# Patient Record
Sex: Male | Born: 1980
Health system: Southern US, Community
[De-identification: ages and names within clinical notes are randomized; demographics above are authoritative.]

## PROBLEM LIST (undated history)

## (undated) DIAGNOSIS — K279 Peptic ulcer, site unspecified, unspecified as acute or chronic, without hemorrhage or perforation: Secondary | ICD-10-CM

## (undated) DIAGNOSIS — K625 Hemorrhage of anus and rectum: Secondary | ICD-10-CM

## (undated) DIAGNOSIS — K219 Gastro-esophageal reflux disease without esophagitis: Secondary | ICD-10-CM

## (undated) DIAGNOSIS — E119 Type 2 diabetes mellitus without complications: Secondary | ICD-10-CM

## (undated) HISTORY — PX: OTHER SURGICAL HISTORY: SHX169

## (undated) HISTORY — PX: COLONOSCOPY: SHX174

## (undated) HISTORY — DX: Gastro-esophageal reflux disease without esophagitis: K21.9

---

## 2000-12-20 ENCOUNTER — Encounter: Payer: Self-pay | Admitting: *Deleted

## 2000-12-20 ENCOUNTER — Emergency Department (HOSPITAL_COMMUNITY): Admission: EM | Admit: 2000-12-20 | Discharge: 2000-12-20 | Payer: Self-pay | Admitting: *Deleted

## 2001-12-22 ENCOUNTER — Emergency Department (HOSPITAL_COMMUNITY): Admission: EM | Admit: 2001-12-22 | Discharge: 2001-12-22 | Payer: Self-pay | Admitting: Emergency Medicine

## 2001-12-22 ENCOUNTER — Emergency Department (HOSPITAL_COMMUNITY): Admission: EM | Admit: 2001-12-22 | Discharge: 2001-12-22 | Payer: Self-pay | Admitting: *Deleted

## 2001-12-29 ENCOUNTER — Emergency Department (HOSPITAL_COMMUNITY): Admission: EM | Admit: 2001-12-29 | Discharge: 2001-12-29 | Payer: Self-pay | Admitting: Emergency Medicine

## 2002-08-29 ENCOUNTER — Emergency Department (HOSPITAL_COMMUNITY): Admission: EM | Admit: 2002-08-29 | Discharge: 2002-08-29 | Payer: Self-pay | Admitting: *Deleted

## 2002-12-21 ENCOUNTER — Emergency Department (HOSPITAL_COMMUNITY): Admission: EM | Admit: 2002-12-21 | Discharge: 2002-12-21 | Payer: Self-pay | Admitting: *Deleted

## 2002-12-21 ENCOUNTER — Encounter: Payer: Self-pay | Admitting: *Deleted

## 2004-05-21 ENCOUNTER — Emergency Department (HOSPITAL_COMMUNITY): Admission: EM | Admit: 2004-05-21 | Discharge: 2004-05-22 | Payer: Self-pay | Admitting: Emergency Medicine

## 2004-06-08 ENCOUNTER — Emergency Department (HOSPITAL_COMMUNITY): Admission: EM | Admit: 2004-06-08 | Discharge: 2004-06-08 | Payer: Self-pay | Admitting: Emergency Medicine

## 2004-06-15 ENCOUNTER — Emergency Department (HOSPITAL_COMMUNITY): Admission: EM | Admit: 2004-06-15 | Discharge: 2004-06-16 | Payer: Self-pay | Admitting: Emergency Medicine

## 2004-09-18 ENCOUNTER — Emergency Department (HOSPITAL_COMMUNITY): Admission: EM | Admit: 2004-09-18 | Discharge: 2004-09-19 | Payer: Self-pay | Admitting: *Deleted

## 2004-10-12 ENCOUNTER — Emergency Department (HOSPITAL_COMMUNITY): Admission: EM | Admit: 2004-10-12 | Discharge: 2004-10-12 | Payer: Self-pay | Admitting: Emergency Medicine

## 2005-04-05 ENCOUNTER — Emergency Department (HOSPITAL_COMMUNITY): Admission: EM | Admit: 2005-04-05 | Discharge: 2005-04-05 | Payer: Self-pay | Admitting: Emergency Medicine

## 2006-06-04 ENCOUNTER — Emergency Department (HOSPITAL_COMMUNITY): Admission: EM | Admit: 2006-06-04 | Discharge: 2006-06-04 | Payer: Self-pay | Admitting: Emergency Medicine

## 2007-07-21 ENCOUNTER — Emergency Department (HOSPITAL_COMMUNITY): Admission: EM | Admit: 2007-07-21 | Discharge: 2007-07-21 | Payer: Self-pay | Admitting: Emergency Medicine

## 2007-07-23 ENCOUNTER — Emergency Department (HOSPITAL_COMMUNITY): Admission: EM | Admit: 2007-07-23 | Discharge: 2007-07-23 | Payer: Self-pay | Admitting: Emergency Medicine

## 2007-07-28 ENCOUNTER — Emergency Department (HOSPITAL_COMMUNITY): Admission: EM | Admit: 2007-07-28 | Discharge: 2007-07-29 | Payer: Self-pay | Admitting: Emergency Medicine

## 2007-10-28 ENCOUNTER — Emergency Department (HOSPITAL_COMMUNITY): Admission: EM | Admit: 2007-10-28 | Discharge: 2007-10-28 | Payer: Self-pay | Admitting: Emergency Medicine

## 2008-01-03 ENCOUNTER — Emergency Department (HOSPITAL_COMMUNITY): Admission: EM | Admit: 2008-01-03 | Discharge: 2008-01-03 | Payer: Self-pay | Admitting: Emergency Medicine

## 2008-10-15 ENCOUNTER — Emergency Department (HOSPITAL_COMMUNITY): Admission: EM | Admit: 2008-10-15 | Discharge: 2008-10-15 | Payer: Self-pay | Admitting: Emergency Medicine

## 2010-03-16 ENCOUNTER — Emergency Department (HOSPITAL_COMMUNITY): Admission: EM | Admit: 2010-03-16 | Discharge: 2010-03-16 | Payer: Self-pay | Admitting: Emergency Medicine

## 2010-05-25 ENCOUNTER — Emergency Department (HOSPITAL_COMMUNITY): Admission: EM | Admit: 2010-05-25 | Discharge: 2010-05-25 | Payer: Self-pay | Admitting: Emergency Medicine

## 2010-10-29 ENCOUNTER — Emergency Department (HOSPITAL_COMMUNITY)
Admission: EM | Admit: 2010-10-29 | Discharge: 2010-10-29 | Disposition: A | Discharge status: Home / Self Care | Payer: Medicaid Other | Attending: Emergency Medicine | Admitting: Emergency Medicine

## 2010-10-29 DIAGNOSIS — W57XXXA Bitten or stung by nonvenomous insect and other nonvenomous arthropods, initial encounter: Secondary | ICD-10-CM | POA: Insufficient documentation

## 2010-10-29 DIAGNOSIS — S30860A Insect bite (nonvenomous) of lower back and pelvis, initial encounter: Secondary | ICD-10-CM | POA: Insufficient documentation

## 2011-02-22 ENCOUNTER — Emergency Department (HOSPITAL_COMMUNITY)
Admission: EM | Admit: 2011-02-22 | Discharge: 2011-02-22 | Disposition: A | Discharge status: Home / Self Care | Payer: Medicaid Other | Attending: Emergency Medicine | Admitting: Emergency Medicine

## 2011-02-22 DIAGNOSIS — Z87891 Personal history of nicotine dependence: Secondary | ICD-10-CM | POA: Insufficient documentation

## 2011-02-22 DIAGNOSIS — R202 Paresthesia of skin: Secondary | ICD-10-CM

## 2011-02-22 DIAGNOSIS — R209 Unspecified disturbances of skin sensation: Secondary | ICD-10-CM | POA: Insufficient documentation

## 2011-02-22 NOTE — ED Provider Notes (Signed)
Scribed for Glynn Octave, MD, the patient was seen in room 12. This chart was scribed by Jannette Fogo. This patient's care was started at 10:07.    CSN: [id number] Arrival date & time: 02/22/2011  9:57 AM  Chief Complaint  Patient presents with  . Numbness   HPI Casey Williams is a 30 y.o. male who presents to the Emergency Department complaining of right calf "tingling" since 07:60M. Patient woke up this AM to urinate and noticed right lower extremity "tingling". He suspected that he slept wrong on the leg last night but the "tingling" sensation has persisted for ~3 hours. Reports a "tingling/asleep" sensation from right ankle to anterior shin but he denies any pain or sensory problem. Patient denies a history of similar symptoms in the past. He denies any associated back pain but reports an episode of left back pain yesterday which resolved. He denies any recent injury or falls. Denies a history of diabetes mellitus. There are no other associated symptoms and no other alleviating or aggravating factors.    HPI ELEMENTS:  Location: Right calf  Onset: at 07:00AM Duration: ~3 hours  Timing: constant   Quality: "tingling'  Context: as above     PAST MEDICAL HISTORY:  1. Acid reflux, occasionally takes Tums 2. Possible hemorrhoids    MEDICATIONS:  Previous Medications   CALCIUM CARBONATE (TUMS EX) 750 MG CHEWABLE TABLET    Chew 1 tablet by mouth daily. For heartburn      ALLERGIES:  Allergies as of 02/22/2011  . (No Known Allergies)     FAMILY HISTORY:  No Pertinent Family History   SOCIAL HISTORY: Accompanied to the ED by aunt who is an Charity fundraiser History  Substance Use Topics  . Smoking status: Former Games developer  . Smokeless tobacco: Not on file  . Alcohol Use: No     Review of Systems  Musculoskeletal: Negative for back pain.  Neurological: Positive for numbness ("tingling" right calf ). Negative for weakness.  All other systems reviewed and are negative.    Physical  Exam  BP 141/70  Pulse 78  Temp(Src) 98.2 F (36.8 C) (Oral)  Resp 20  Ht 5\' 10"  (1.778 m)  Wt 289 lb (131.09 kg)  BMI 41.47 kg/m2  SpO2 99%  Physical Exam  Constitutional: He is oriented to person, place, and time. He appears well-developed and well-nourished. No distress.  HENT:  Head: Normocephalic and atraumatic.  Mouth/Throat: Oropharynx is clear and moist.  Eyes: Conjunctivae are normal. Pupils are equal, round, and reactive to light.  Neck: Normal range of motion. Neck supple.  Cardiovascular: Intact distal pulses.   Pulses:      Dorsalis pedis pulses are 2+ on the right side, and 2+ on the left side.       Posterior tibial pulses are 2+ on the right side, and 2+ on the left side.  Pulmonary/Chest: Effort normal.  Abdominal: Soft. He exhibits no distension.  Musculoskeletal: Normal range of motion. He exhibits no edema and no tenderness.       Cervical back: Normal. He exhibits no tenderness.       Thoracic back: Normal. He exhibits no tenderness.       Lumbar back: Normal. He exhibits no tenderness.       Normal bilateral straight leg raise test.  Right calf is soft and non tender. No asymmetrical  swelling.   Neurological: He is alert and oriented to person, place, and time. He has normal strength and normal reflexes. No cranial  nerve deficit or sensory deficit.       Pinprick sensation intact in L4-L4-S1. Great toe extension intact.      Procedures  OTHER DATA REVIEWED: Nursing notes and vital signs reviewed.  DIAGNOSTIC STUDIES: Oxygen Saturation is 99% on room air, normal by my interpretation.    LABS: Results for orders placed during the hospital encounter of 02/22/11  GLUCOSE, CAPILLARY      Component Value Range   Glucose-Capillary 150 (*) 70 - 99 (mg/dL)    ED COURSE / COORDINATION OF CARE: 10:49 - Re-examine, ED physician discussed Glucose level with the patient and family. Advised patient to ger reevaluated if symptoms persist or radiate to the  back or up the leg. Patient stable for discharge.    MDM: paresthesias, appear peripheral.  Involves L4 and L5 dermatomes but no motor weakness, pinprick sensation intact.   IMPRESSION: Diagnoses that have been ruled out:  Diagnoses that are still under consideration:  Final diagnoses:  Paresthesia    PLAN:  Home  The patient is to return the emergency department if there is any worsening of symptoms. I have reviewed the discharge instructions with the patient and family,    CONDITION ON DISCHARGE: Stable   MEDICATIONS GIVEN IN THE E.D.  Medications  calcium carbonate (TUMS EX) 750 MG chewable tablet (not administered)     DISCHARGE MEDICATIONS: New Prescriptions   No medications on file   I personally performed the services described in this documentation, which was scribed in my presence.  The recorded information has been reviewed and considered.        Glynn Octave, MD 02/22/11 1426

## 2011-02-22 NOTE — ED Notes (Signed)
Pt reports waking this a.m with numbness and tingling in rt leg.  Pt denies any injury.  Pt states that he has feeling in it but can't make the tingling sensation go away.  nad noted

## 2011-04-01 LAB — WOUND CULTURE

## 2011-04-02 LAB — I-STAT 8, (EC8 V) (CONVERTED LAB)
Acid-Base Excess: 4 — ABNORMAL HIGH
Chloride: 102
TCO2: 32
pCO2, Ven: 50.5 — ABNORMAL HIGH
pH, Ven: 7.383 — ABNORMAL HIGH

## 2011-04-07 LAB — URINE CULTURE
Colony Count: NO GROWTH
Culture: NO GROWTH

## 2011-04-07 LAB — URINALYSIS, ROUTINE W REFLEX MICROSCOPIC
Glucose, UA: NEGATIVE
pH: 6

## 2011-05-27 ENCOUNTER — Emergency Department (HOSPITAL_COMMUNITY)
Admission: EM | Admit: 2011-05-27 | Discharge: 2011-05-27 | Disposition: A | Discharge status: Home / Self Care | Payer: Medicaid Other | Attending: Emergency Medicine | Admitting: Emergency Medicine

## 2011-05-27 ENCOUNTER — Encounter (HOSPITAL_COMMUNITY): Payer: Self-pay | Admitting: *Deleted

## 2011-05-27 DIAGNOSIS — K529 Noninfective gastroenteritis and colitis, unspecified: Secondary | ICD-10-CM

## 2011-05-27 DIAGNOSIS — H539 Unspecified visual disturbance: Secondary | ICD-10-CM | POA: Insufficient documentation

## 2011-05-27 DIAGNOSIS — Z87891 Personal history of nicotine dependence: Secondary | ICD-10-CM | POA: Insufficient documentation

## 2011-05-27 DIAGNOSIS — R42 Dizziness and giddiness: Secondary | ICD-10-CM | POA: Insufficient documentation

## 2011-05-27 DIAGNOSIS — K5289 Other specified noninfective gastroenteritis and colitis: Secondary | ICD-10-CM | POA: Insufficient documentation

## 2011-05-27 LAB — DIFFERENTIAL
Basophils Absolute: 0 10*3/uL (ref 0.0–0.1)
Basophils Relative: 0 % (ref 0–1)
Eosinophils Absolute: 0 10*3/uL (ref 0.0–0.7)
Lymphs Abs: 1 10*3/uL (ref 0.7–4.0)
Neutrophils Relative %: 82 % — ABNORMAL HIGH (ref 43–77)

## 2011-05-27 LAB — CBC
MCH: 26.4 pg (ref 26.0–34.0)
MCHC: 32.3 g/dL (ref 30.0–36.0)
Platelets: 195 10*3/uL (ref 150–400)
RBC: 4.36 MIL/uL (ref 4.22–5.81)
RDW: 13.8 % (ref 11.5–15.5)

## 2011-05-27 LAB — COMPREHENSIVE METABOLIC PANEL
ALT: 89 U/L — ABNORMAL HIGH (ref 0–53)
AST: 61 U/L — ABNORMAL HIGH (ref 0–37)
Albumin: 4 g/dL (ref 3.5–5.2)
Alkaline Phosphatase: 78 U/L (ref 39–117)
Potassium: 4 mEq/L (ref 3.5–5.1)
Sodium: 133 mEq/L — ABNORMAL LOW (ref 135–145)
Total Protein: 8.5 g/dL — ABNORMAL HIGH (ref 6.0–8.3)

## 2011-05-27 MED ORDER — SODIUM CHLORIDE 0.9 % IV BOLUS (SEPSIS)
1000.0000 mL | Freq: Once | INTRAVENOUS | Status: AC
  Administered 2011-05-27: 1000 mL via INTRAVENOUS

## 2011-05-27 MED ORDER — SODIUM CHLORIDE 0.9 % IV BOLUS (SEPSIS): 1000.0000 mL | Freq: Once | INTRAVENOUS | Status: DC

## 2011-05-27 MED ORDER — ONDANSETRON HCL 4 MG/2ML IJ SOLN
4.0000 mg | Freq: Once | INTRAMUSCULAR | Status: AC
  Administered 2011-05-27: 4 mg via INTRAVENOUS
  Filled 2011-05-27: qty 2

## 2011-05-27 MED ORDER — PROMETHAZINE HCL 25 MG PO TABS: 25.0000 mg | ORAL_TABLET | Freq: Four times a day (QID) | ORAL | Status: DC | PRN

## 2011-05-27 MED ORDER — CIPROFLOXACIN HCL 500 MG PO TABS: 500.0000 mg | ORAL_TABLET | Freq: Two times a day (BID) | ORAL | Status: AC

## 2011-05-27 NOTE — ED Provider Notes (Signed)
Scribed for Benny Lennert, MD, the patient was seen in room APA12/APA12. This chart was scribed by [employer]. The patient's care started at 19:39  CSN: [id number] Arrival date & time: 05/27/2011  7:10 PM   First MD Initiated Contact with Patient 05/27/11 1939      Chief Complaint  Patient presents with  . Excessive Sweating  . Emesis  . Diarrhea  . Dizziness  . Abdominal Pain   HPI Casey Williams is a 30 y.o. male who presents to the Emergency Department complaining of Excessive Sweating, Emesis, Diarrhea, Dizziness and Abdominal pain onset yesterday. Reports blood in diarrhea. He also complains of mild abdominal pain starting since yesterday. He describes his sweating as "cold sweats". Denies any hematemesis. He also reports blurred vision and dizziness. Patient denies a history of similar symptoms and is currently in no apparent distress.  History reviewed. No pertinent past medical history.  History reviewed. No pertinent past surgical history.  History reviewed. No pertinent family history.  History  Substance Use Topics  . Smoking status: Former Games developer  . Smokeless tobacco: Not on file  . Alcohol Use: No      Review of Systems  Constitutional: Negative for fatigue.  HENT: Negative for congestion, sinus pressure and ear discharge.   Eyes: Positive for visual disturbance. Negative for discharge.  Respiratory: Negative for cough.   Cardiovascular: Negative for chest pain.  Gastrointestinal: Positive for nausea, vomiting, diarrhea and blood in stool. Negative for abdominal pain.  Genitourinary: Negative for dysuria, frequency and hematuria.  Musculoskeletal: Negative for back pain.  Skin: Negative for rash.  Neurological: Positive for dizziness. Negative for seizures and headaches.  Hematological: Negative.   Psychiatric/Behavioral: Negative for hallucinations.  All other systems reviewed and are negative.    Allergies  Review of patient's allergies  indicates no known allergies.  Home Medications   Current Outpatient Rx  Name Route Sig Dispense Refill  . CALCIUM CARBONATE ANTACID 750 MG PO CHEW Oral Chew 1 tablet by mouth daily. For heartburn    . CIPROFLOXACIN HCL 500 MG PO TABS Oral Take 1 tablet (500 mg total) by mouth every 12 (twelve) hours. 14 tablet 0  . PROMETHAZINE HCL 25 MG PO TABS Oral Take 1 tablet (25 mg total) by mouth every 6 (six) hours as needed for nausea. 15 tablet 0    BP 116/71  Pulse 121  Temp(Src) 99.2 F (37.3 C) (Oral)  Resp 18  Ht 5\' 10"  (1.778 m)  Wt 285 lb (129.275 kg)  BMI 40.89 kg/m2  SpO2 96%  Physical Exam  Vitals reviewed. Constitutional: He is oriented to person, place, and time. He appears well-developed.  HENT:  Head: Normocephalic and atraumatic.  Mouth/Throat: Mucous membranes are dry.  Eyes: Conjunctivae and EOM are normal. No scleral icterus.  Neck: Neck supple. No thyromegaly present.  Cardiovascular: Normal rate and regular rhythm.  Exam reveals no gallop and no friction rub.   No murmur heard. Pulmonary/Chest: No stridor. He has no wheezes. He has no rales. He exhibits no tenderness.  Abdominal: He exhibits no distension. There is no tenderness. There is no rebound.  Musculoskeletal: Normal range of motion. He exhibits no edema.  Lymphadenopathy:    He has no cervical adenopathy.  Neurological: He is oriented to person, place, and time. Coordination normal.  Skin: No rash noted. No erythema.  Psychiatric: He has a normal mood and affect. His behavior is normal.    ED Course  Procedures DIAGNOSTIC STUDIES: Oxygen Saturation  is 96% on room air, normal by my interpretation.    COORDINATION OF CARE: 20:30 - EDP examined patient at bedside. IV Fluids ordered. Orders Placed This Encounter  Procedures  . CBC  . Differential  . Comprehensive metabolic panel    Results for orders placed during the hospital encounter of 05/27/11  CBC      Component Value Range   WBC 9.8   4.0 - 10.5 (K/uL)   RBC 4.36  4.22 - 5.81 (MIL/uL)   Hemoglobin 11.5 (*) 13.0 - 17.0 (g/dL)   HCT 21.3 (*) 08.6 - 52.0 (%)   MCV 81.7  78.0 - 100.0 (fL)   MCH 26.4  26.0 - 34.0 (pg)   MCHC 32.3  30.0 - 36.0 (g/dL)   RDW 57.8  46.9 - 62.9 (%)   Platelets 195  150 - 400 (K/uL)  DIFFERENTIAL      Component Value Range   Neutrophils Relative 82 (*) 43 - 77 (%)   Neutro Abs 8.1 (*) 1.7 - 7.7 (K/uL)   Lymphocytes Relative 10 (*) 12 - 46 (%)   Lymphs Abs 1.0  0.7 - 4.0 (K/uL)   Monocytes Relative 7  3 - 12 (%)   Monocytes Absolute 0.7  0.1 - 1.0 (K/uL)   Eosinophils Relative 0  0 - 5 (%)   Eosinophils Absolute 0.0  0.0 - 0.7 (K/uL)   Basophils Relative 0  0 - 1 (%)   Basophils Absolute 0.0  0.0 - 0.1 (K/uL)  COMPREHENSIVE METABOLIC PANEL      Component Value Range   Sodium 133 (*) 135 - 145 (mEq/L)   Potassium 4.0  3.5 - 5.1 (mEq/L)   Chloride 97  96 - 112 (mEq/L)   CO2 26  19 - 32 (mEq/L)   Glucose, Bld 177 (*) 70 - 99 (mg/dL)   BUN 9  6 - 23 (mg/dL)   Creatinine, Ser 5.28  0.50 - 1.35 (mg/dL)   Calcium 9.6  8.4 - 41.3 (mg/dL)   Total Protein 8.5 (*) 6.0 - 8.3 (g/dL)   Albumin 4.0  3.5 - 5.2 (g/dL)   AST 61 (*) 0 - 37 (U/L)   ALT 89 (*) 0 - 53 (U/L)   Alkaline Phosphatase 78  39 - 117 (U/L)   Total Bilirubin 0.6  0.3 - 1.2 (mg/dL)   GFR calc non Af Amer >90  >90 (mL/min)   GFR calc Af Amer >90  >90 (mL/min)     colitis  The chart was scribed for me under my direct supervision.  I personally performed the history, physical, and medical decision making and all procedures in the evaluation of this patient.Benny Lennert, MD 05/27/11 [phone]

## 2011-05-27 NOTE — ED Notes (Signed)
Pt states nausea is better; pt sitting in chair at bedside sipping on water with no complaints and is comfortable

## 2011-05-27 NOTE — ED Notes (Signed)
Pt c/o n/v/d and dizziness, pt states he feels weak and is having blurred vision

## 2011-09-28 ENCOUNTER — Encounter (HOSPITAL_COMMUNITY): Payer: Self-pay | Admitting: Oncology

## 2011-09-28 ENCOUNTER — Emergency Department (HOSPITAL_COMMUNITY)
Admission: EM | Admit: 2011-09-28 | Discharge: 2011-09-28 | Disposition: A | Discharge status: Home / Self Care | Payer: Medicaid Other | Attending: Emergency Medicine | Admitting: Emergency Medicine

## 2011-09-28 DIAGNOSIS — K648 Other hemorrhoids: Secondary | ICD-10-CM | POA: Insufficient documentation

## 2011-09-28 DIAGNOSIS — K625 Hemorrhage of anus and rectum: Secondary | ICD-10-CM | POA: Insufficient documentation

## 2011-09-28 HISTORY — DX: Hemorrhage of anus and rectum: K62.5

## 2011-09-28 LAB — OCCULT BLOOD, POC DEVICE: Fecal Occult Bld: POSITIVE

## 2011-09-28 LAB — DIFFERENTIAL
Basophils Absolute: 0 10*3/uL (ref 0.0–0.1)
Basophils Relative: 0 % (ref 0–1)
Eosinophils Relative: 3 % (ref 0–5)
Lymphocytes Relative: 33 % (ref 12–46)
Monocytes Absolute: 0.5 10*3/uL (ref 0.1–1.0)
Monocytes Relative: 5 % (ref 3–12)

## 2011-09-28 LAB — CBC
HCT: 36.1 % — ABNORMAL LOW (ref 39.0–52.0)
Hemoglobin: 11.9 g/dL — ABNORMAL LOW (ref 13.0–17.0)
MCHC: 33 g/dL (ref 30.0–36.0)
MCV: 82.4 fL (ref 78.0–100.0)
RDW: 13.9 % (ref 11.5–15.5)

## 2011-09-28 LAB — BASIC METABOLIC PANEL
BUN: 9 mg/dL (ref 6–23)
CO2: 26 mEq/L (ref 19–32)
Calcium: 9.7 mg/dL (ref 8.4–10.5)
Creatinine, Ser: 0.87 mg/dL (ref 0.50–1.35)

## 2011-09-28 MED ORDER — SODIUM CHLORIDE 0.9 % IV BOLUS (SEPSIS): 1000.0000 mL | Freq: Once | INTRAVENOUS | Status: DC

## 2011-09-28 NOTE — Discharge Instructions (Signed)
Make an appointment with a gastroenterologist for further evaluation. Return to the emergency department if your bleeding is getting worse, or if he started feeling dizzy or lightheaded.  Rectal Bleeding Rectal bleeding is when blood passes out of the anus. It is usually a sign that something is wrong. It may not be serious, but it should always be evaluated. Rectal bleeding may present as bright red blood or extremely dark stools. The color may range from dark red or maroon to black (like tar). It is important that the cause of rectal bleeding be identified so treatment can be started and the problem corrected. CAUSES   Hemorrhoids. These are enlarged (dilated) blood vessels or veins in the anal or rectal area.   Fistulas. Theseare abnormal, burrowing channels that usually run from inside the rectum to the skin around the anus. They can bleed.   Anal fissures. This is a tear in the tissue of the anus. Bleeding occurs with bowel movements.   Diverticulosis. This is a condition in which pockets or sacs project from the bowel wall. Occasionally, the sacs can bleed.   Diverticulitis. Thisis an infection involving diverticulosis of the colon.   Proctitis and colitis. These are conditions in which the rectum, colon, or both, can become inflamed and pitted (ulcerated).   Polyps and cancer. Polyps are non-cancerous (benign) growths in the colon that may bleed. Certain types of polyps turn into cancer.   Protrusion of the rectum. Part of the rectum can project from the anus and bleed.   Certain medicines.   Intestinal infections.   Blood vessel abnormalities.  HOME CARE INSTRUCTIONS  Eat a high-fiber diet to keep your stool soft.   Limit activity.   Drink enough fluids to keep your urine clear or pale yellow.   Warm baths may be useful to soothe rectal pain.   Follow up with your caregiver as directed.  SEEK IMMEDIATE MEDICAL CARE IF:  You develop increased bleeding.   You have  black or dark red stools.   You vomit blood or material that looks like coffee grounds.   You have abdominal pain or tenderness.   You have a fever.   You feel weak, nauseous, or you faint.   You have severe rectal pain or you are unable to have a bowel movement.  MAKE SURE YOU:  Understand these instructions.   Will watch your condition.   Will get help right away if you are not doing well or get worse.  Document Released: 12/19/2001 Document Revised: 06/18/2011 Document Reviewed: 12/14/2010 [facility] Patient Information 2012 [place], [place].

## 2011-09-28 NOTE — ED Provider Notes (Signed)
History   This chart was scribed for Dione Booze, MD by Clarita Crane. The patient was seen in room APA12/APA12. Patient's care was started at 0734.    CSN: [id number]  Arrival date & time 09/28/11  4098   First MD Initiated Contact with Patient 09/28/11 [phone]      Chief Complaint  Patient presents with  . Rectal Bleeding    (Consider location/radiation/quality/duration/timing/severity/associated sxs/prior treatment) HPI Casey Williams is a 31 y.o. male who presents to the Emergency Department complaining of constant moderate hematochezia onset 2 weeks ago and persistent since with mild intermittent abdominal pain. Patient states blood is mixed with stool but reports he will visualize an increased volume of blood with wiping. Reports symptoms are aggravated by nothing and were temporarily relieved while using antibiotics. Denies nausea, vomiting, dizziness, lightheadedness. Reports experiencing similar hematochezia 6 months ago. Patient is a non drinker and non-smoker.  PCP- [facility]  Past Medical History  Diagnosis Date  . Bright red rectal bleeding     History reviewed. No pertinent past surgical history.  No family history on file.  History  Substance Use Topics  . Smoking status: Former Games developer  . Smokeless tobacco: Not on file  . Alcohol Use: Yes     social      Review of Systems  Allergies  Review of patient's allergies indicates no known allergies.  Home Medications   Current Outpatient Rx  Name Route Sig Dispense Refill  . CALCIUM CARBONATE ANTACID 750 MG PO CHEW Oral Chew 1 tablet by mouth daily. For heartburn      BP 132/81  Pulse 86  Temp(Src) 98.9 F (37.2 C) (Oral)  Resp 16  SpO2 97%  Physical Exam  Nursing note and vitals reviewed. Constitutional: He is oriented to person, place, and time. He appears well-developed and well-nourished. No distress.  HENT:  Head: Normocephalic and atraumatic.  Eyes: EOM are normal. Pupils are equal, round, and  reactive to light.  Neck: Neck supple. No tracheal deviation present.  Cardiovascular: Normal rate and regular rhythm.   No murmur heard. Pulmonary/Chest: Effort normal. No respiratory distress. He has no wheezes. He has no rales.  Abdominal: Soft. He exhibits no distension.  Genitourinary:       Chaperone present for rectal exam. Small internal hemorrhoid present. Stool is [place] and hemeoccult positive.   Musculoskeletal: Normal range of motion. He exhibits no edema.  Neurological: He is alert and oriented to person, place, and time. No sensory deficit.  Skin: Skin is warm and dry.  Psychiatric: He has a normal mood and affect. His behavior is normal.    ED Course  Procedures (including critical care time)  DIAGNOSTIC STUDIES: Oxygen Saturation is 97% on room air, normal by my interpretation.    COORDINATION OF CARE: 7:54AM- Dione Booze, MD to bedside to perform Rectal exam.  10:50AM- Patient informed of lab results and intent to arrange follow up for possible colonoscopy. Advised of reasons to return to ED. Patient agrees with plan set forth.  Results for orders placed during the hospital encounter of 09/28/11  CBC      Component Value Range   WBC 10.0  4.0 - 10.5 (K/uL)   RBC 4.38  4.22 - 5.81 (MIL/uL)   Hemoglobin 11.9 (*) 13.0 - 17.0 (g/dL)   HCT 47.8 (*) 29.5 - 52.0 (%)   MCV 82.4  78.0 - 100.0 (fL)   MCH 27.2  26.0 - 34.0 (pg)   MCHC 33.0  30.0 - 36.0 (  g/dL)   RDW 40.9  81.1 - 91.4 (%)   Platelets 213  150 - 400 (K/uL)  DIFFERENTIAL      Component Value Range   Neutrophils Relative 58  43 - 77 (%)   Neutro Abs 5.8  1.7 - 7.7 (K/uL)   Lymphocytes Relative 33  12 - 46 (%)   Lymphs Abs 3.3  0.7 - 4.0 (K/uL)   Monocytes Relative 5  3 - 12 (%)   Monocytes Absolute 0.5  0.1 - 1.0 (K/uL)   Eosinophils Relative 3  0 - 5 (%)   Eosinophils Absolute 0.3  0.0 - 0.7 (K/uL)   Basophils Relative 0  0 - 1 (%)   Basophils Absolute 0.0  0.0 - 0.1 (K/uL)  BASIC METABOLIC PANEL       Component Value Range   Sodium 138  135 - 145 (mEq/L)   Potassium 4.1  3.5 - 5.1 (mEq/L)   Chloride 102  96 - 112 (mEq/L)   CO2 26  19 - 32 (mEq/L)   Glucose, Bld 134 (*) 70 - 99 (mg/dL)   BUN 9  6 - 23 (mg/dL)   Creatinine, Ser 7.82  0.50 - 1.35 (mg/dL)   Calcium 9.7  8.4 - 95.6 (mg/dL)   GFR calc non Af Amer >90  >90 (mL/min)   GFR calc Af Amer >90  >90 (mL/min)  OCCULT BLOOD, POC DEVICE      Component Value Range   Fecal Occult Bld POSITIVE     Hemoglobin is not significantly different from last recorded hemoglobin. He has had no active bleeding in the emergency department. Orthostatic vital signs are significant for a rise in heart rate, but no change in pulse. He should have a GI evaluation for his bleeding. The only definite finding I found was a small hemorrhoid which may indeed be the source of some of this bleeding appeared is referred to Dr. Darrick Penna who is on call for GI for followup.  1. Rectal bleeding       MDM  Rectal bleeding, cause uncertain.      I personally performed the services described in this documentation, which was scribed in my presence. The recorded information has been reviewed and considered.      Dione Booze, MD 09/28/11 1054

## 2011-09-28 NOTE — ED Notes (Signed)
Casey Williams reports history of rectal bleeding, states he was previously told he had a "lower intestine" infection and was "treated with antibiotics."  Today patient is very vague on the onset of symptoms for today's visit - just states that he has noticed worsening bright red blood in his stool x "a while."  Pt denies vomiting, diarrhea, or fevers.

## 2011-09-29 ENCOUNTER — Encounter: Payer: Self-pay | Admitting: Gastroenterology

## 2011-09-29 ENCOUNTER — Ambulatory Visit (INDEPENDENT_AMBULATORY_CARE_PROVIDER_SITE_OTHER): Payer: Medicaid Other | Admitting: Gastroenterology

## 2011-09-29 VITALS — BP 130/78 | HR 66 | Temp 98.4°F | Ht 70.0 in | Wt 295.2 lb

## 2011-09-29 DIAGNOSIS — D649 Anemia, unspecified: Secondary | ICD-10-CM | POA: Insufficient documentation

## 2011-09-29 DIAGNOSIS — K6289 Other specified diseases of anus and rectum: Secondary | ICD-10-CM | POA: Insufficient documentation

## 2011-09-29 DIAGNOSIS — K625 Hemorrhage of anus and rectum: Secondary | ICD-10-CM

## 2011-09-29 DIAGNOSIS — K219 Gastro-esophageal reflux disease without esophagitis: Secondary | ICD-10-CM | POA: Insufficient documentation

## 2011-09-29 DIAGNOSIS — K5900 Constipation, unspecified: Secondary | ICD-10-CM | POA: Insufficient documentation

## 2011-09-29 MED ORDER — POLYETHYLENE GLYCOL 3350 17 GM/SCOOP PO POWD: 17.0000 g | Freq: Every day | ORAL | Status: DC

## 2011-09-29 MED ORDER — OMEPRAZOLE 20 MG PO CPDR: 20.0000 mg | DELAYED_RELEASE_CAPSULE | Freq: Every day | ORAL | Status: DC

## 2011-09-29 NOTE — Assessment & Plan Note (Signed)
4 month history of intermittent rectal bleeding generally associated with passing a hard stool and having rectal pain. Likely anorectal fissure or hemorrhoid. Doubt IBD. Recommend colonoscopy for further evaluation. Patient also with mild normocytic anemia which needs to be further evaluated.  I have discussed the risks, alternatives, benefits with regards to but not limited to the risk of reaction to medication, bleeding, infection, perforation and the patient is agreeable to proceed. Written consent to be obtained.   He will add MiraLax 17 g daily. Handout on constipation provided.

## 2011-09-29 NOTE — Progress Notes (Signed)
Primary Care Physician:  HOWARD,KEVIN P, MD, MD  Primary Gastroenterologist:  Sandi Fields, MD   Chief Complaint  Patient presents with  . Rectal Bleeding  . Heartburn    HPI:  Casey Williams is a 31 y.o. male here for further evaluation of chronic heartburn and rectal bleeding. He was seen in the emergency department yesterday with complaints of increasing amounts of rectal bleeding. Four months ago started with tearing pain in rectum with hard stools, also with n/v/d. Seen in ED, ?inflammed colon, given abx and cleared up. Since that time intermittent BRBPR noted predominantly with hard stool. Other day increased bleeding.noted increased volume of rectal bleeding but no blood clots or diarrhea. Red blood mostly with hard stool. Tender DRE yesterday. Per medical records from the ER, suspected to have a hemorrhoid. Stool was [place] and heme positive. BM usually every day to every other day. No stomach pain. A lot of heartburn. All the time, TUMS four tablets daily. Five years or more. No dysphagia. No regular NSAIDS or ASA use. No FH of IBD, colon cancer.    Current Outpatient Prescriptions  Medication Sig Dispense Refill  . calcium carbonate (TUMS EX) 750 MG chewable tablet Chew 1 tablet by mouth daily. For heartburn          Allergies as of 09/29/2011  . (No Known Allergies)   Past Medical History  Diagnosis Date  . Bright red rectal bleeding   . GERD (gastroesophageal reflux disease)    Past Surgical History  Procedure Date  . None    Family History  Problem Relation Age of Onset  . Colon cancer Neg Hx   . Liver disease Neg Hx   . Cancer Mother     deceased age 50, ?lung     History   Social History  . Marital Status: Married    Spouse Name: N/A    Number of Children: 3  . Years of Education: N/A   Occupational History  . [employer]    Social History Main Topics  . Smoking status: Former Smoker    Types: Cigars    Quit date: 09/29/2006  . Smokeless tobacco:  None   Comment: smoked cigars 2 daily for 8 years  . Alcohol Use: Yes     socially, one beer on weekend  . Drug Use: No  . Sexually Active: None   Other Topics Concern  . None   Social History Narrative  . None      ROS:  General: Negative for anorexia, weight loss, fever, chills, fatigue, weakness. Eyes: Negative for vision changes.  ENT: Negative for hoarseness, difficulty swallowing , nasal congestion. CV: Negative for chest pain, angina, palpitations, dyspnea on exertion, peripheral edema.  Respiratory: Negative for dyspnea at rest, dyspnea on exertion, cough, sputum, wheezing.  GI: See history of present illness. GU:  Negative for dysuria, hematuria, urinary incontinence, urinary frequency, nocturnal urination.  MS: Negative for joint pain, low back pain.  Derm: Negative for rash or itching.  Neuro: Negative for weakness, abnormal sensation, seizure, frequent headaches, memory loss, confusion.  Psych: Negative for anxiety, depression, suicidal ideation, hallucinations.  Endo: Negative for unusual weight change.  Heme: Negative for bruising or bleeding. Allergy: Negative for rash or hives.    Physical Examination:  BP 130/78  Pulse 66  Temp(Src) 98.4 F (36.9 C) (Temporal)  Ht 5' 10" (1.778 m)  Wt 295 lb 3.2 oz (133.902 kg)  BMI 42.36 kg/m2   General: Well-nourished, well-developed in no   acute distress.  Head: Normocephalic, atraumatic.   Eyes: Conjunctiva pink, no icterus. Mouth: Oropharyngeal mucosa moist and pink , no lesions erythema or exudate. Neck: Supple without thyromegaly, masses, or lymphadenopathy.  Lungs: Clear to auscultation bilaterally.  Heart: Regular rate and rhythm, no murmurs rubs or gallops.  Abdomen: Bowel sounds are normal, nontender, nondistended, no hepatosplenomegaly or masses, no abdominal bruits or    hernia , no rebound or guarding.   Rectal: Defer to time of TCS. Done in ED yesterday, small internal hemorrhoid suspected, [place] heme  positive stool. Extremities: No lower extremity edema. No clubbing or deformities.  Neuro: Alert and oriented x 4 , grossly normal neurologically.  Skin: Warm and dry, no rash or jaundice.   Psych: Alert and cooperative, normal mood and affect.  Labs: Lab Results  Component Value Date   WBC 10.0 09/28/2011   HGB 11.9* 09/28/2011   HCT 36.1* 09/28/2011   MCV 82.4 09/28/2011   PLT 213 09/28/2011     Imaging Studies: No results found.    

## 2011-09-29 NOTE — Progress Notes (Signed)
Cc to [facility] 

## 2011-09-29 NOTE — Patient Instructions (Signed)
please start omeprazole 20 mg daily for acid reflux. Prescription has been called to pharmacy. Please start MiraLax 17 g daily. This can be purchased over-the-counter, you may use name brand or store brand equivalent. We have scheduled you for a colonoscopy and upper endoscopy. Please see separate instructions.  Gastroesophageal Reflux Disease, Adult Gastroesophageal reflux disease (GERD) happens when acid from your stomach goes into your food pipe (esophagus). The acid can cause a burning feeling in your chest. Over time, the acid can make small holes (ulcers) in your food pipe.  HOME CARE  Ask your doctor for advice about:   Losing weight.   Quitting smoking.   Alcohol use.   Avoid foods and drinks that make your problems worse. You may want to avoid:   Caffeine and alcohol.   Chocolate.   Mints.   Garlic and onions.   Spicy foods.   Citrus fruits, such as oranges, lemons, or limes.   Foods that contain tomato, such as sauce, chili, salsa, and pizza.   Fried and fatty foods.   Avoid lying down for 3 hours before you go to bed or before you take a nap.   Eat small meals often, instead of large meals.   Wear loose-fitting clothing. Do not wear anything tight around your waist.   Raise (elevate) the head of your bed 6 to 8 inches with wood blocks. Using extra pillows does not help.   Only take medicines as told by your doctor.   Do not take aspirin or ibuprofen.  GET HELP RIGHT AWAY IF:   You have pain in your arms, neck, jaw, teeth, or back.   Your pain gets worse or changes.   You feel sick to your stomach (nauseous), throw up (vomit), or sweat (diaphoresis).   You feel short of breath, or you pass out (faint).   Your throw up is green, yellow, black, or looks like coffee grounds or blood.   Your poop (stool) is red, bloody, or black.  MAKE SURE YOU:   Understand these instructions.   Will watch your condition.   Will get help right away if you are not  doing well or get worse.  Document Released: 12/16/2007 Document Revised: 06/18/2011 Document Reviewed: 01/16/2011 [facility] Patient Information 2012 [place], [place].   Constipation Constipation is when you poop (have a bowel movement) less than 3 times a week. Sometimes the poop (stool) is small and hard. There are many different causes of constipation.  HOME CARE   Go to the bathroom when you feel the urge to go.   Drink enough fluid to keep your pee (urine) clear or pale yellow.   Eat more high-fiber foods.   Drink prune juice or eat stewed fruits in the morning.   Exercise.   Ask your doctor if you should take medicine to help you poop or take fiber supplements.  GET HELP RIGHT AWAY IF:   You see blood in your poop or in the toilet.   Your belly (abdomen) gets hard and puffy (swollen).   You keep throwing up (vomiting).   You have a lot of pain.  MAKE SURE YOU:   Understand these instructions.   Will watch your condition.   Will get help right away if you are not doing well or get worse.  Document Released: 12/16/2007 Document Revised: 06/18/2011 Document Reviewed: 06/02/2011 [facility] Patient Information 2012 [place], [place].

## 2011-09-29 NOTE — Assessment & Plan Note (Signed)
Chronic GERD, daily TUMS. Mild normocytic anemia, unclear etiology. Also abnormal 4 months ago, hemoglobin 11.5 at that time. Recommend EGD for further evaluation. Will add omeprazole 20 mg daily. Anti-reflex measures/GERD handout provided.  I have discussed the risks, alternatives, benefits with regards to but not limited to the risk of reaction to medication, bleeding, infection, perforation and the patient is agreeable to proceed. Written consent to be obtained.

## 2011-09-30 ENCOUNTER — Telehealth: Payer: Self-pay | Admitting: Gastroenterology

## 2011-09-30 ENCOUNTER — Other Ambulatory Visit: Payer: Self-pay

## 2011-09-30 NOTE — Telephone Encounter (Signed)
Opened in era  

## 2011-10-01 ENCOUNTER — Telehealth: Payer: Self-pay

## 2011-10-01 ENCOUNTER — Encounter (HOSPITAL_COMMUNITY): Payer: Self-pay | Admitting: Pharmacy Technician

## 2011-10-01 NOTE — Telephone Encounter (Signed)
Pt came by the office on 09/30/2011 afternoon. He is aware his appt with Dr. Darrick Penna for his EGD/TCS is on 10/06/2011 at 12:30 PM and he will need t register at 11:30 AM at [facility] short stay. He was given instructions. His prescription was faxed to [facility] in [place].

## 2011-10-06 ENCOUNTER — Ambulatory Visit (HOSPITAL_COMMUNITY)
Admission: RE | Admit: 2011-10-06 | Discharge: 2011-10-06 | Disposition: A | Discharge status: Home / Self Care | Payer: Medicaid Other | Source: Ambulatory Visit | Attending: Gastroenterology | Admitting: Gastroenterology

## 2011-10-06 ENCOUNTER — Encounter (HOSPITAL_COMMUNITY): Payer: Self-pay | Admitting: *Deleted

## 2011-10-06 ENCOUNTER — Encounter (HOSPITAL_COMMUNITY)
Admission: RE | Disposition: A | Discharge status: Home / Self Care | Payer: Self-pay | Source: Ambulatory Visit | Attending: Gastroenterology

## 2011-10-06 DIAGNOSIS — K648 Other hemorrhoids: Secondary | ICD-10-CM | POA: Insufficient documentation

## 2011-10-06 DIAGNOSIS — D509 Iron deficiency anemia, unspecified: Secondary | ICD-10-CM | POA: Insufficient documentation

## 2011-10-06 DIAGNOSIS — K921 Melena: Secondary | ICD-10-CM | POA: Insufficient documentation

## 2011-10-06 DIAGNOSIS — K2990 Gastroduodenitis, unspecified, without bleeding: Secondary | ICD-10-CM

## 2011-10-06 DIAGNOSIS — R195 Other fecal abnormalities: Secondary | ICD-10-CM

## 2011-10-06 DIAGNOSIS — D649 Anemia, unspecified: Secondary | ICD-10-CM

## 2011-10-06 DIAGNOSIS — K2940 Chronic atrophic gastritis without bleeding: Secondary | ICD-10-CM | POA: Insufficient documentation

## 2011-10-06 DIAGNOSIS — A048 Other specified bacterial intestinal infections: Secondary | ICD-10-CM | POA: Insufficient documentation

## 2011-10-06 DIAGNOSIS — K625 Hemorrhage of anus and rectum: Secondary | ICD-10-CM

## 2011-10-06 DIAGNOSIS — K2970 Gastritis, unspecified, without bleeding: Secondary | ICD-10-CM

## 2011-10-06 SURGERY — COLONOSCOPY, ESOPHAGOGASTRODUODENOSCOPY (EGD) AND ESOPHAGEAL DILATION (ED)
Anesthesia: Moderate Sedation

## 2011-10-06 MED ORDER — MIDAZOLAM HCL 5 MG/5ML IJ SOLN
INTRAMUSCULAR | Status: DC | PRN
  Administered 2011-10-06: 1 mg via INTRAVENOUS
  Administered 2011-10-06 (×3): 2 mg via INTRAVENOUS
  Administered 2011-10-06 (×3): 1 mg via INTRAVENOUS

## 2011-10-06 MED ORDER — SODIUM CHLORIDE 0.9 % IJ SOLN
INTRAMUSCULAR | Status: AC
  Filled 2011-10-06: qty 10

## 2011-10-06 MED ORDER — MIDAZOLAM HCL 5 MG/5ML IJ SOLN
INTRAMUSCULAR | Status: AC
  Filled 2011-10-06: qty 10

## 2011-10-06 MED ORDER — MEPERIDINE HCL 100 MG/ML IJ SOLN
INTRAMUSCULAR | Status: DC | PRN
  Administered 2011-10-06 (×3): 25 mg via INTRAVENOUS
  Administered 2011-10-06: 50 mg via INTRAVENOUS

## 2011-10-06 MED ORDER — BUTAMBEN-TETRACAINE-BENZOCAINE 2-2-14 % EX AERO
INHALATION_SPRAY | CUTANEOUS | Status: DC | PRN
  Administered 2011-10-06: 2 via TOPICAL

## 2011-10-06 MED ORDER — STERILE WATER FOR IRRIGATION IR SOLN
Status: DC | PRN
  Administered 2011-10-06: 12:00:00

## 2011-10-06 MED ORDER — SODIUM CHLORIDE 0.45 % IV SOLN
INTRAVENOUS | Status: DC
  Administered 2011-10-06: 12:00:00 via INTRAVENOUS

## 2011-10-06 MED ORDER — MEPERIDINE HCL 100 MG/ML IJ SOLN
INTRAMUSCULAR | Status: AC
  Filled 2011-10-06: qty 2

## 2011-10-06 MED ORDER — PROMETHAZINE HCL 25 MG/ML IJ SOLN
INTRAMUSCULAR | Status: AC
  Filled 2011-10-06: qty 1

## 2011-10-06 NOTE — H&P (View-Only) (Signed)
Primary Care Physician:  Criss Rosales, MD, MD  Primary Gastroenterologist:  Jonette Eva, MD   Chief Complaint  Patient presents with  . Rectal Bleeding  . Heartburn    HPI:  Casey Williams is a 31 y.o. male here for further evaluation of chronic heartburn and rectal bleeding. He was seen in the emergency department yesterday with complaints of increasing amounts of rectal bleeding. Four months ago started with tearing pain in rectum with hard stools, also with n/v/d. Seen in ED, ?inflammed colon, given abx and cleared up. Since that time intermittent BRBPR noted predominantly with hard stool. Other day increased bleeding.noted increased volume of rectal bleeding but no blood clots or diarrhea. Red blood mostly with hard stool. Tender DRE yesterday. Per medical records from the ER, suspected to have a hemorrhoid. Stool was Bussiere and heme positive. BM usually every day to every other day. No stomach pain. A lot of heartburn. All the time, TUMS four tablets daily. Five years or more. No dysphagia. No regular NSAIDS or ASA use. No FH of IBD, colon cancer.    Current Outpatient Prescriptions  Medication Sig Dispense Refill  . calcium carbonate (TUMS EX) 750 MG chewable tablet Chew 1 tablet by mouth daily. For heartburn          Allergies as of 09/29/2011  . (No Known Allergies)   Past Medical History  Diagnosis Date  . Bright red rectal bleeding   . GERD (gastroesophageal reflux disease)    Past Surgical History  Procedure Date  . None    Family History  Problem Relation Age of Onset  . Colon cancer Neg Hx   . Liver disease Neg Hx   . Cancer Mother     deceased age 36, ?lung     History   Social History  . Marital Status: Married    Spouse Name: N/A    Number of Children: 3  . Years of Education: N/A   Occupational History  . [employer]    Social History Main Topics  . Smoking status: Former Smoker    Types: Cigars    Quit date: 09/29/2006  . Smokeless tobacco:  None   Comment: smoked cigars 2 daily for 8 years  . Alcohol Use: Yes     socially, one beer on weekend  . Drug Use: No  . Sexually Active: None   Other Topics Concern  . None   Social History Narrative  . None      ROS:  General: Negative for anorexia, weight loss, fever, chills, fatigue, weakness. Eyes: Negative for vision changes.  ENT: Negative for hoarseness, difficulty swallowing , nasal congestion. CV: Negative for chest pain, angina, palpitations, dyspnea on exertion, peripheral edema.  Respiratory: Negative for dyspnea at rest, dyspnea on exertion, cough, sputum, wheezing.  GI: See history of present illness. GU:  Negative for dysuria, hematuria, urinary incontinence, urinary frequency, nocturnal urination.  MS: Negative for joint pain, low back pain.  Derm: Negative for rash or itching.  Neuro: Negative for weakness, abnormal sensation, seizure, frequent headaches, memory loss, confusion.  Psych: Negative for anxiety, depression, suicidal ideation, hallucinations.  Endo: Negative for unusual weight change.  Heme: Negative for bruising or bleeding. Allergy: Negative for rash or hives.    Physical Examination:  BP 130/78  Pulse 66  Temp(Src) 98.4 F (36.9 C) (Temporal)  Ht 5\' 10"  (1.778 m)  Wt 295 lb 3.2 oz (133.902 kg)  BMI 42.36 kg/m2   General: Well-nourished, well-developed in no  acute distress.  Head: Normocephalic, atraumatic.   Eyes: Conjunctiva pink, no icterus. Mouth: Oropharyngeal mucosa moist and pink , no lesions erythema or exudate. Neck: Supple without thyromegaly, masses, or lymphadenopathy.  Lungs: Clear to auscultation bilaterally.  Heart: Regular rate and rhythm, no murmurs rubs or gallops.  Abdomen: Bowel sounds are normal, nontender, nondistended, no hepatosplenomegaly or masses, no abdominal bruits or    hernia , no rebound or guarding.   Rectal: Defer to time of TCS. Done in ED yesterday, small internal hemorrhoid suspected, Paragas heme  positive stool. Extremities: No lower extremity edema. No clubbing or deformities.  Neuro: Alert and oriented x 4 , grossly normal neurologically.  Skin: Warm and dry, no rash or jaundice.   Psych: Alert and cooperative, normal mood and affect.  Labs: Lab Results  Component Value Date   WBC 10.0 09/28/2011   HGB 11.9* 09/28/2011   HCT 36.1* 09/28/2011   MCV 82.4 09/28/2011   PLT 213 09/28/2011     Imaging Studies: No results found.

## 2011-10-06 NOTE — Interval H&P Note (Signed)
History and Physical Interval Note:  10/06/2011 12:15 PM  Casey Williams  has presented today for surgery, with the diagnosis of Chronic gerd/ rectal bleed/ rectal pain/constipation/anemia  The various methods of treatment have been discussed with the patient and family. After consideration of risks, benefits and other options for treatment, the patient has consented to  Procedure(s) (LRB): COLONOSCOPY, ESOPHAGOGASTRODUODENOSCOPY (EGD) AND ESOPHAGEAL DILATION (N/A) as a surgical intervention .  The patients' history has been reviewed, patient examined, no change in status, stable for surgery.  I have reviewed the patients' chart and labs.  Questions were answered to the patient's satisfaction.     [employer]

## 2011-10-06 NOTE — Discharge Instructions (Signed)
You have internal hemorrhoids. You have gastritis. YOUR LOW BLOOD COUNT MAY DUE TO USING NAPROXEN, GASTRITIS, AND RECTAL BLEEDING.  I biopsied your stomach & duodenum.   YOU NEED YOUR IRON STORES CHECKED TODAY. YOUR BIOPSY RESULTS WILL BE AVAILABLE IN 7 DAYS.  FOR CONSTIPATION, GASTRITIS, & REFLUX: 1. CONTINUE OMEPRAZOLE EVERY MORNING. 2. STOP USING TUMS AND NAPROXEN. DO NOT USE BC, GOODY'S OR ADVIL/IBUPROFEN. 3. Use MAALOX OR MYLANTA FOR AS NEEDED FOR HEARTBURN. 4. USE TYLENOL AS NEEDED FOR PAIN. 5. AVOID ITEMS THAT TRIGGER GASTRITIS. SEE INFO BELOW/ 6. FOLLOW A HIGH FIBER/LOW FAT DIET. AVOID ITEMS THAT CAUSE BLOATING. SEE INFO BELOW. 7. YOU SHOULD BEGIN A WEIGHT LOSS PROGRAM. YOUR BODY MASS INDEX IS 42 CONSISTENT WITH BEING MORBIDLY OBESE. 8. DRINK WATER SO YOUR URINE STAYS LIGHT YELLOW. 9. USE MIRALAX EVERY MON, WED, FRI.  FOLLOW UP IN 3 MOS IF YOU BLOOD COUNT REMAINS LOW. YOU WILL NEED A CAPSULE ENDOSCOPY.   ENDOSCOPY Care After Read the instructions outlined below and refer to this sheet in the next week. These discharge instructions provide you with general information on caring for yourself after you leave the hospital. While your treatment has been planned according to the most current medical practices available, unavoidable complications occasionally occur. If you have any problems or questions after discharge, call DR. Advik Weatherspoon, [phone].  ACTIVITY  You may resume your regular activity, but move at a slower pace for the next 24 hours.   Take frequent rest periods for the next 24 hours.   Walking will help get rid of the air and reduce the bloated feeling in your belly (abdomen).   No driving for 24 hours (because of the medicine (anesthesia) used during the test).   You may shower.   Do not sign any important legal documents or operate any machinery for 24 hours (because of the anesthesia used during the test).    NUTRITION  Drink plenty of fluids.   You may  resume your normal diet as instructed by your doctor.   Begin with a light meal and progress to your normal diet. Heavy or fried foods are harder to digest and may make you feel sick to your stomach (nauseated).   Avoid alcoholic beverages for 24 hours or as instructed.    MEDICATIONS  You may resume your normal medications.   WHAT YOU CAN EXPECT TODAY  Some feelings of bloating in the abdomen.   Passage of more gas than usual.   Spotting of blood in your stool or on the toilet paper  .  IF YOU HAD POLYPS REMOVED DURING THE ENDOSCOPY:  Eat a soft diet IF YOU HAVE NAUSEA, BLOATING, ABDOMINAL PAIN, OR VOMITING.    FINDING OUT THE RESULTS OF YOUR TEST Not all test results are available during your visit. DR. Darrick Penna WILL CALL YOU WITHIN 7 DAYS OF YOUR PROCEDUE WITH YOUR RESULTS. Do not assume everything is normal if you have not heard from DR. Ericson Nafziger IN ONE WEEK, CALL HER OFFICE AT [phone].  SEEK IMMEDIATE MEDICAL ATTENTION AND CALL THE OFFICE: [phone] IF:  You have more than a spotting of blood in your stool.   Your belly is swollen (abdominal distention).   You are nauseated or vomiting.   You have a temperature over 101F.   You have abdominal pain or discomfort that is severe or gets worse throughout the day.   Gastritis  Gastritis is an inflammation (the body's way of reacting to injury and/or infection) of the stomach.  It is often caused by viral or bacterial (germ) infections. It can also be caused BY ASPIRIN, BC/GOODY POWDER'S, (IBUPROFEN) MOTRIN, OR ALEVE (NAPROXEN), chemicals (including alcohol), SPICY FOODS, and medications. This illness may be associated with generalized malaise (feeling tired, not well), UPPER ABDOMINAL STOMACH cramps, and fever. One common bacterial cause of gastritis is an organism known as H. Pylori. This can be treated with antibiotics.    High-Fiber Diet A high-fiber diet changes your normal diet to include more whole grains,  legumes, fruits, and vegetables. Changes in the diet involve replacing refined carbohydrates with unrefined foods. The calorie level of the diet is essentially unchanged. The Dietary Reference Intake (recommended amount) for adult males is 38 grams per day. For adult females, it is 25 grams per day. Pregnant and lactating women should consume 28 grams of fiber per day. Fiber is the intact part of a plant that is not broken down during digestion. Functional fiber is fiber that has been isolated from the plant to provide a beneficial effect in the body. PURPOSE  Increase stool bulk.   Ease and regulate bowel movements.   Lower cholesterol.  INDICATIONS THAT YOU NEED MORE FIBER  Constipation and hemorrhoids.   Uncomplicated diverticulosis (intestine condition) and irritable bowel syndrome.   Weight management.   As a protective measure against hardening of the arteries (atherosclerosis), diabetes, and cancer.   GUIDELINES FOR INCREASING FIBER IN THE DIET  Start adding fiber to the diet slowly. A gradual increase of about 5 more grams (2 slices of whole-wheat bread, 2 servings of most fruits or vegetables, or 1 bowl of high-fiber cereal) per day is best. Too rapid an increase in fiber may result in constipation, flatulence, and bloating.   Drink enough water and fluids to keep your urine clear or pale yellow. Water, juice, or caffeine-free drinks are recommended. Not drinking enough fluid may cause constipation.   Eat a variety of high-fiber foods rather than one type of fiber.   Try to increase your intake of fiber through using high-fiber foods rather than fiber pills or supplements that contain small amounts of fiber.   The goal is to change the types of food eaten. Do not supplement your present diet with high-fiber foods, but replace foods in your present diet.  INCLUDE A VARIETY OF FIBER SOURCES  Replace refined and processed grains with whole grains, canned fruits with fresh  fruits, and incorporate other fiber sources. White rice, white breads, and most bakery goods contain little or no fiber.   [place]terson whole-grain rice, buckwheat oats, and many fruits and vegetables are all good sources of fiber. These include: broccoli, Brussels sprouts, cabbage, cauliflower, beets, sweet potatoes, white potatoes (skin on), carrots, tomatoes, eggplant, squash, berries, fresh fruits, and dried fruits.   Cereals appear to be the richest source of fiber. Cereal fiber is found in whole grains and bran. Bran is the fiber-rich outer coat of cereal grain, which is largely removed in refining. In whole-grain cereals, the bran remains. In breakfast cereals, the largest amount of fiber is found in those with "bran" in their names. The fiber content is sometimes indicated on the label.   You may need to include additional fruits and vegetables each day.   In baking, for 1 cup white flour, you may use the following substitutions:   1 cup whole-wheat flour minus 2 tablespoons.   1/2 cup white flour plus 1/2 cup whole-wheat flour.   Low-Fat Diet BREADS, CEREALS, PASTA, RICE, DRIED PEAS, AND BEANS  These products are high in carbohydrates and most are low in fat. Therefore, they can be increased in the diet as substitutes for fatty foods. They too, however, contain calories and should not be eaten in excess. Cereals can be eaten for snacks as well as for breakfast.  Include foods that contain fiber (fruits, vegetables, whole grains, and legumes). Research shows that fiber may lower blood cholesterol levels, especially the water-soluble fiber found in fruits, vegetables, oat products, and legumes. FRUITS AND VEGETABLES It is good to eat fruits and vegetables. Besides being sources of fiber, both are rich in vitamins and some minerals. They help you get the daily allowances of these nutrients. Fruits and vegetables can be used for snacks and desserts. MEATS Limit lean meat, chicken, [place], and fish  to no more than 6 ounces per day. Beef, Pork, and Lamb Use lean cuts of beef, pork, and lamb. Lean cuts include:  Extra-lean ground beef.  Arm roast.  Sirloin tip.  Center-cut ham.  Round steak.  Loin chops.  Rump roast.  Tenderloin.  Trim all fat off the outside of meats before cooking. It is not necessary to severely decrease the intake of red meat, but lean choices should be made. Lean meat is rich in protein and contains a highly absorbable form of iron. Premenopausal women, in particular, should avoid reducing lean red meat because this could increase the risk for low red blood cells (iron-deficiency anemia). The organ meats, such as liver, sweetbreads, kidneys, and brain are very rich in cholesterol. They should be limited. Chicken and [place] These are good sources of protein. The fat of poultry can be reduced by removing the skin and underlying fat layers before cooking. Chicken and [place] can be substituted for lean red meat in the diet. Poultry should not be fried or covered with high-fat sauces. Fish and Shellfish Fish is a good source of protein. Shellfish contain cholesterol, but they usually are low in saturated fatty acids. The preparation of fish is important. Like chicken and [place], they should not be fried or covered with high-fat sauces. EGGS Egg whites contain no fat or cholesterol. They can be eaten often. Try 1 to 2 egg whites instead of whole eggs in recipes or use egg substitutes that do not contain yolk. MILK AND DAIRY PRODUCTS Use skim or 1% milk instead of 2% or whole milk. Decrease whole milk, natural, and processed cheeses. Use nonfat or low-fat (2%) cottage cheese or low-fat cheeses made from vegetable oils. Choose nonfat or low-fat (1 to 2%) yogurt. Experiment with evaporated skim milk in recipes that call for heavy cream. Substitute low-fat yogurt or low-fat cottage cheese for sour cream in dips and salad dressings. Have at least 2 servings of low-fat dairy  products, such as 2 glasses of skim (or 1%) milk each day to help get your daily calcium intake.  FATS AND OILS Reduce the total intake of fats, especially saturated fat. Butterfat, lard, and beef fats are high in saturated fat and cholesterol. These should be avoided as much as possible. Vegetable fats do not contain cholesterol, but certain vegetable fats, such as coconut oil, palm oil, and palm kernel oil are very high in saturated fats. These should be limited. These fats are often used in bakery goods, processed foods, popcorn, oils, and nondairy creamers. Vegetable shortenings and some peanut butters contain hydrogenated oils, which are also saturated fats. Read the labels on these foods and check for saturated vegetable oils. Unsaturated vegetable oils and fats do  not raise blood cholesterol. However, they should be limited because they are fats and are high in calories. Total fat should still be limited to 30% of your daily caloric intake. Desirable liquid vegetable oils are corn oil, cottonseed oil, olive oil, canola oil, safflower oil, soybean oil, and sunflower oil. Peanut oil is not as good, but small amounts are acceptable. Buy a heart-healthy tub margarine that has no partially hydrogenated oils in the ingredients. Mayonnaise and salad dressings often are made from unsaturated fats, but they should also be limited because of their high calorie and fat content. Seeds, nuts, peanut butter, olives, and avocados are high in fat, but the fat is mainly the unsaturated type. These foods should be limited mainly to avoid excess calories and fat. OTHER EATING TIPS Snacks  Most sweets should be limited as snacks. They tend to be rich in calories and fats, and their caloric content outweighs their nutritional value. Some good choices in snacks are graham crackers, melba toast, soda crackers, bagels (no egg), English muffins, fruits, and vegetables. These snacks are preferable to snack crackers, [place]  fries, and chips. Popcorn should be air-popped or cooked in small amounts of liquid vegetable oil. Desserts Eat fruit, low-fat yogurt, and fruit ices. AVOID pastries, cake, and cookies. Sherbet, angel food cake, gelatin dessert, frozen low-fat yogurt, or other frozen products that do not contain saturated fat (pure fruit juice bars, frozen ice pops) are also acceptable.  COOKING METHODS Choose those methods that use little or no fat. They include: Poaching.  Braising.  Steaming.  Grilling.  Baking.  Stir-frying.  Broiling.  Microwaving.  Foods can be cooked in a nonstick pan without added fat, or use a nonfat cooking spray in regular cookware. Limit fried foods and avoid frying in saturated fat. Add moisture to lean meats by using water, broth, cooking wines, and other nonfat or low-fat sauces along with the cooking methods mentioned above. Soups and stews should be chilled after cooking. The fat that forms on top after a few hours in the refrigerator should be skimmed off. When preparing meals, avoid using excess salt. Salt can contribute to raising blood pressure in some people. EATING AWAY FROM HOME Order entres, potatoes, and vegetables without sauces or butter. When meat exceeds the size of a deck of cards (3 to 4 ounces), the rest can be taken home for another meal. Choose vegetable or fruit salads and ask for low-calorie salad dressings to be served on the side. Use dressings sparingly. Limit high-fat toppings, such as bacon, crumbled eggs, cheese, sunflower seeds, and olives. Ask for heart-healthy tub margarine instead of butter.   Hemorrhoids Hemorrhoids are dilated (enlarged) veins around the rectum. Sometimes clots will form in the veins. This makes them swollen and painful. These are called thrombosed hemorrhoids. Causes of hemorrhoids include:  Constipation.   Straining to have a bowel movement.   HEAVY LIFTING HOME CARE INSTRUCTIONS  Eat a well balanced diet and drink 6  to 8 glasses of water every day to avoid constipation. You may also use a bulk laxative.   Avoid straining to have bowel movements.   Keep anal area dry and clean.   Do not use a donut shaped pillow or sit on the toilet for long periods. This increases blood pooling and pain.   Move your bowels when your body has the urge; this will require less straining and will decrease pain and pressure.

## 2011-10-06 NOTE — Op Note (Signed)
[facility] [address] [place], [place]  [address]  DR. FIELD'S TCS EGD PROCEDURE REPORT  PATIENT:  Casey Williams, Casey Williams  MR#:  [id number] BIRTHDATE:  [birth year], 31 yrs. old  GENDER:  male  ENDOSCOPIST:  Jonette Eva, MD REF. BY:  Selinda Flavin, M.D. ASSISTANT:  PROCEDURE DATE:  10/06/2011 PROCEDURE:  Colonoscopy with biopsy, Colonoscopy 19147  INDICATIONS:  NORMOCYTIC ANEMIA, HEME POS STOOLS, RECTAL BLEEDING   MEDICATIONS:   Demerol 125 mg IV, Versed 10 mg IV  DESCRIPTION OF PROCEDURE:    Physical exam was performed. Informed consent was obtained from the patient after explaining the benefits, risks, and alternatives to procedure.  The patient was connected to monitor and placed in left lateral position. Continuous oxygen was provided by nasal cannula and IV medicine administered through an indwelling cannula.  After administration of sedation, the patient's esophagus was intubated and the EC-3890Li ([id number]) and EG-2990i ([id number]) endoscope was advanced under direct visualization to the second portion of the duodenum. The scope was removed slowly by carefully examining the color, texture, anatomy, and integrity of the mucosa on the way out.  After administration of sedation and rectal exam, the patient's rectum was intubated and the EC-3890Li ([id number]) and EG-2990i ([id number]) colonoscope was advanced under direct visualization to the cecum.  The scope was removed slowly by carefully examining the color, texture, anatomy, and integrity mucosa on the way out. The patient was recovered in endoscopy and discharged home in satisfactory condition. <<PROCEDUREIMAGES>>  FINDINGS:  SMALL Internal Hemorrhoids were found. MODERATE GASTRITIS BIOPSIED VIA COLD FORCEPS NL ESOPHAGUS, DUODENUM,  COLON. UNABLE TO INTUBATE ILEUM DUE TO REDUDANT COLON.  PREP QUALITY: GOOD WITH IRRIGATION CECAL W/D TIME:    20 minutes  COMPLICATIONS:    None  ENDOSCOPIC IMPRESSION: 1) Internal  hemorrhoids 2) GASTRITIS  RECOMMENDATIONS: DAILY PPI AWAIT BIOPSIES AVOID NSAIDS & TUMS DRINK WATER AVOID CONSTIPATION. MIRALAX MWF. HIGH FIBER/LOW FAT DIET CHECK FERRITIN TODAY LOSE WEIGHT OPV IN 3 MOS IF HB LOW PT WILL NEED CAPSULE ENDOSCOPY  REPEAT EXAM:  No  ______________________________ Jonette Eva, MD  CC:  Selinda Flavin, M.D.  n. eSIGNED:   Kupono Marling at 10/06/2011 01:57 PM  Kathreen Devoid, [phone]

## 2011-10-13 ENCOUNTER — Telehealth: Payer: Self-pay | Admitting: Gastroenterology

## 2011-10-13 NOTE — Telephone Encounter (Signed)
Results Cc to PCP & reminder is in the computer 

## 2011-10-13 NOTE — Telephone Encounter (Signed)
Pt informed and Meds called to [facility] in [place].

## 2011-10-13 NOTE — Telephone Encounter (Signed)
Called pt. His number is not working. Called the emergency number, his wife, [phone], [facility] for a return call.

## 2011-10-13 NOTE — Telephone Encounter (Signed)
PLEASE CALL PT. HIS stomach Bx showed H. Pylori infection. IT IS THE REASON FOR HIS LOW BLOOD COUNT. He needs AMOXICILLIN 500 mg 2 po BID for 10 days and Biaxin 500 mg po bid for 10 days, #qs, rfx0. TAKE OMEPRAZOLE TWICE DAILY FOR 10 DAYS THEN DAILY. EAT A PALM SIZE SERVING OF MEAT DAILY. Med side effects include NVD, abd pain, and metallic taste.  FOR YOUR CONSTIPATION, & REFLUX: STOP USING TUMS AND NAPROXEN. DO NOT USE BC, GOODY'S OR ADVIL/IBUPROFEN. Use MAALOX OR MYLANTA FOR AS NEEDED FOR HEARTBURN. USE TYLENOL AS NEEDED FOR PAIN. AVOID ITEMS THAT TRIGGER GASTRITIS.  FOLLOW A HIGH FIBER/LOW FAT DIET. AVOID ITEMS THAT CAUSE BLOATING.  CONTINUE YOUR WEIGHT LOSS PROGRAM.  DRINK WATER SO YOUR URINE STAYS LIGHT YELLOW. USE MIRALAX EVERY MON, WED, FRI.  FOLLOW UP IN 3 MOS & WE WILL RECHECK YOU BLOOD COUNT.

## 2011-11-05 NOTE — Progress Notes (Signed)
EGD H PYLORI GASTRITIS TCS IH  REVIEWED.

## 2011-11-09 ENCOUNTER — Encounter (HOSPITAL_COMMUNITY): Payer: Self-pay | Admitting: *Deleted

## 2011-11-09 ENCOUNTER — Emergency Department (HOSPITAL_COMMUNITY)
Admission: EM | Admit: 2011-11-09 | Discharge: 2011-11-09 | Disposition: A | Discharge status: Home / Self Care | Payer: Medicaid Other | Attending: Emergency Medicine | Admitting: Emergency Medicine

## 2011-11-09 DIAGNOSIS — IMO0002 Reserved for concepts with insufficient information to code with codable children: Secondary | ICD-10-CM | POA: Insufficient documentation

## 2011-11-09 DIAGNOSIS — T148XXA Other injury of unspecified body region, initial encounter: Secondary | ICD-10-CM

## 2011-11-09 DIAGNOSIS — K219 Gastro-esophageal reflux disease without esophagitis: Secondary | ICD-10-CM | POA: Insufficient documentation

## 2011-11-09 DIAGNOSIS — Z87891 Personal history of nicotine dependence: Secondary | ICD-10-CM | POA: Insufficient documentation

## 2011-11-09 DIAGNOSIS — Y998 Other external cause status: Secondary | ICD-10-CM | POA: Insufficient documentation

## 2011-11-09 DIAGNOSIS — X500XXA Overexertion from strenuous movement or load, initial encounter: Secondary | ICD-10-CM | POA: Insufficient documentation

## 2011-11-09 DIAGNOSIS — Y9341 Activity, dancing: Secondary | ICD-10-CM | POA: Insufficient documentation

## 2011-11-09 MED ORDER — CYCLOBENZAPRINE HCL 5 MG PO TABS: 5.0000 mg | ORAL_TABLET | Freq: Three times a day (TID) | ORAL | Status: AC | PRN

## 2011-11-09 MED ORDER — HYDROCODONE-ACETAMINOPHEN 5-325 MG PO TABS: 1.0000 | ORAL_TABLET | ORAL | Status: AC | PRN

## 2011-11-09 NOTE — ED Notes (Signed)
Discharge instructions reviewed with pt, questions answered. Pt verbalized understanding.  

## 2011-11-09 NOTE — Discharge Instructions (Signed)
Muscle Strain A muscle strain (pulled muscle) happens when a muscle is over-stretched. Recovery usually takes 5 to 6 weeks.  HOME CARE   Put ice on the injured area.   Put ice in a plastic bag.   Place a towel between your skin and the bag.   Leave the ice on for 15 to 20 minutes at a time, every hour for the first 2 days.   Do not use the muscle for several days or until your doctor says you can. Do not use the muscle if you have pain.   Wrap the injured area with an elastic bandage for comfort. Do not put it on too tightly.   Only take medicine as told by your doctor.   Warm up before exercise. This helps prevent muscle strains.  GET HELP RIGHT AWAY IF:  There is increased pain or puffiness (swelling) in the affected area. MAKE SURE YOU:   Understand these instructions.   Will watch your condition.   Will get help right away if you are not doing well or get worse.  Document Released: 04/07/2008 Document Revised: 06/18/2011 Document Reviewed: 04/07/2008 [facility] Patient Information 2012 [place], [place].  Use the medicines prescribed along with warm water shower massage 10 to 15 minutes several times daily.  Use caution with the medications prescribed as they will make you drowsy.  Do not drive within 4 hours of taking these prescriptions.

## 2011-11-09 NOTE — ED Provider Notes (Signed)
History     CSN: [id number]  Arrival date & time 11/09/11  1609   First MD Initiated Contact with Patient 11/09/11 1623      Chief Complaint  Patient presents with  . Back Pain    (Consider location/radiation/quality/duration/timing/severity/associated sxs/prior treatment) HPI Comments: Patient presents for evaluation of left upper back pain which started yesterday evening.  He was very active yesterday afternoon at the children's birthday party, stating he was "dancing around" and lifting children weighing up to 60 pounds during this event.  After he was home he noticed left upper back pain which has progressed.  Pain is worse with movement, especially forward flexion and right rotation of his trunk and with abduction of his left arm across his chest..  It is better at rest.  He has taken Tylenol with no improvement in pain.  He denies weakness or numbness or tingling in his upper or lower extremities.  Patient is a 31 y.o. male presenting with back pain. The history is provided by the patient.  Back Pain  Pertinent negatives include no chest pain, no fever, no numbness, no abdominal pain, no dysuria and no weakness.    Past Medical History  Diagnosis Date  . Bright red rectal bleeding   . GERD (gastroesophageal reflux disease)     Past Surgical History  Procedure Date  . None     Family History  Problem Relation Age of Onset  . Colon cancer Neg Hx   . Liver disease Neg Hx   . Cancer Mother     deceased age 45, ?lung    History  Substance Use Topics  . Smoking status: Former Smoker    Types: Cigars    Quit date: 09/29/2006  . Smokeless tobacco: Not on file   Comment: smoked cigars 2 daily for 8 years  . Alcohol Use: No     socially, one beer on weekend      Review of Systems  Constitutional: Negative for fever.  Respiratory: Negative for shortness of breath.   Cardiovascular: Negative for chest pain and leg swelling.  Gastrointestinal: Negative for abdominal  pain, constipation and abdominal distention.  Genitourinary: Negative for dysuria, urgency, frequency, flank pain and difficulty urinating.  Musculoskeletal: Positive for back pain. Negative for joint swelling and gait problem.  Skin: Negative for rash.  Neurological: Negative for weakness and numbness.    Allergies  Aspirin  Home Medications   Current Outpatient Rx  Name Route Sig Dispense Refill  . ACETAMINOPHEN 500 MG PO TABS Oral Take 1,000 mg by mouth once as needed. For pain    . OMEPRAZOLE 20 MG PO CPDR Oral Take 1 capsule (20 mg total) by mouth daily before breakfast. 30 capsule 11  . CYCLOBENZAPRINE HCL 5 MG PO TABS Oral Take 1 tablet (5 mg total) by mouth 3 (three) times daily as needed for muscle spasms. 15 tablet 0  . HYDROCODONE-ACETAMINOPHEN 5-325 MG PO TABS Oral Take 1 tablet by mouth every 4 (four) hours as needed for pain. 15 tablet 0    BP 138/89  Pulse 68  Temp(Src) 97.8 F (36.6 C) (Oral)  Resp 20  Ht 5\' 10"  (1.778 m)  Wt 290 lb (131.543 kg)  BMI 41.61 kg/m2  SpO2 99%  Physical Exam  Nursing note and vitals reviewed. Constitutional: He appears well-developed and well-nourished.  HENT:  Head: Normocephalic.  Eyes: Conjunctivae are normal.  Neck: Normal range of motion. Neck supple.  Cardiovascular: Normal rate and intact distal pulses.  Pedal pulses normal.  Pulmonary/Chest: Effort normal.  Abdominal: Soft. Bowel sounds are normal. He exhibits no distension and no mass.  Musculoskeletal: Normal range of motion. He exhibits tenderness. He exhibits no edema.       Thoracic back: He exhibits tenderness and spasm. He exhibits no bony tenderness.       Back:  Neurological: He is alert. He has normal strength. He displays no atrophy and no tremor. No sensory deficit. Gait normal.  Reflex Scores:      Bicep reflexes are 2+ on the right side and 2+ on the left side.      Patellar reflexes are 2+ on the right side and 2+ on the left side. Skin: Skin is  warm and dry.  Psychiatric: He has a normal mood and affect.    ED Course  Procedures (including critical care time)  Labs Reviewed - No data to display No results found.   1. Muscle strain       MDM  Patient is currently being treated for an active peptic ulcer, therefore NSAIDs were avoided today.  He was prescribed hydrocodone and Flexeril for muscle spasm.  Also encouraged heat therapy followed by gentle range of motion.  Referral to his PCP or return here for worsen symptoms or symptoms that are not resolved over the next week.    Patient has no chest pain, no shortness of breath.  Vital signs are normal.  Pain is reproducible with movement and with palpation.  No neuro deficit on exam or by history to suggest emergent or surgical presentation.       Burgess Amor, PA 11/09/11 1641  Burgess Amor, PA 11/09/11 1642

## 2011-11-09 NOTE — ED Notes (Signed)
Low back pain after going to childrens party and "dancing around"

## 2011-11-11 NOTE — ED Provider Notes (Signed)
Medical screening examination/treatment/procedure(s) were performed by non-physician practitioner and as supervising physician I was immediately available for consultation/collaboration.  Nicoletta Dress. Colon Branch, MD 11/11/11 1415

## 2011-11-26 ENCOUNTER — Emergency Department (HOSPITAL_COMMUNITY)
Admission: EM | Admit: 2011-11-26 | Discharge: 2011-11-26 | Disposition: A | Discharge status: Home / Self Care | Payer: Medicaid Other | Attending: Emergency Medicine | Admitting: Emergency Medicine

## 2011-11-26 ENCOUNTER — Emergency Department (HOSPITAL_COMMUNITY): Payer: Medicaid Other

## 2011-11-26 ENCOUNTER — Encounter (HOSPITAL_COMMUNITY): Payer: Self-pay | Admitting: *Deleted

## 2011-11-26 DIAGNOSIS — R0789 Other chest pain: Secondary | ICD-10-CM | POA: Insufficient documentation

## 2011-11-26 DIAGNOSIS — Z87891 Personal history of nicotine dependence: Secondary | ICD-10-CM | POA: Insufficient documentation

## 2011-11-26 DIAGNOSIS — J3489 Other specified disorders of nose and nasal sinuses: Secondary | ICD-10-CM | POA: Insufficient documentation

## 2011-11-26 HISTORY — DX: Peptic ulcer, site unspecified, unspecified as acute or chronic, without hemorrhage or perforation: K27.9

## 2011-11-26 LAB — BASIC METABOLIC PANEL
BUN: 12 mg/dL (ref 6–23)
Calcium: 9.6 mg/dL (ref 8.4–10.5)
Creatinine, Ser: 0.86 mg/dL (ref 0.50–1.35)
GFR calc Af Amer: >90 mL/min (ref >90)
GFR calc non Af Amer: >90 mL/min (ref >90)
Glucose, Bld: 160 mg/dL — ABNORMAL HIGH (ref 70–99)

## 2011-11-26 LAB — CBC
HCT: 35.8 % — ABNORMAL LOW (ref 39.0–52.0)
Hemoglobin: 11.8 g/dL — ABNORMAL LOW (ref 13.0–17.0)
MCH: 26.9 pg (ref 26.0–34.0)
MCHC: 33 g/dL (ref 30.0–36.0)
MCV: 81.7 fL (ref 78.0–100.0)
RDW: 14.3 % (ref 11.5–15.5)

## 2011-11-26 LAB — D-DIMER, QUANTITATIVE: D-Dimer, Quant: 0.33 ug/mL-FEU (ref 0.00–0.48)

## 2011-11-26 NOTE — ED Notes (Signed)
Pt states nasal congestion x 2 days. States constant pain to left chest, which began at 0700. Currently rated at 2. States pain is worse with certain movements and taking deep breaths. NAD

## 2011-11-26 NOTE — ED Provider Notes (Cosign Needed)
History  This chart was scribed for Carleene Cooper III, MD by Stevphen Meuse. This patient was seen in room APA01/APA01 and the patient's care was started at 9:08PM.    CSN: [id number]  Arrival date & time 11/26/11  5621   First MD Initiated Contact with Patient 11/26/11 [phone]      Chief Complaint  Patient presents with  . Chest Pain  . Nasal Congestion    (Consider location/radiation/quality/duration/timing/severity/associated sxs/prior treatment) Patient is a 31 y.o. male presenting with chest pain. The history is provided by the patient.  Chest Pain The chest pain began 1 - 2 hours ago. The pain is currently at 2/10. The quality of the pain is described as sharp and dull. Chest pain is worsened by deep breathing and certain positions. Pertinent negatives for primary symptoms include no fever, no cough and no vomiting. He tried nothing for the symptoms. Risk factors include male gender.  Pertinent negatives for past medical history include no seizures.    Casey Williams is a 31 y.o. male who presents to the Emergency Department complaining of approximately 2 hours of gradual onset, gradually worsening non radiating chest pain with associated nasal congestion. Pt states that his chest pain is worsened by deep breathing and certain positions. He states that when he takes a deep breath or moves a certain way he feels a sharp pain in his chest that becomes dull and then fades away when he sits still. 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  Pt denies taking any medication for his current symptoms. Pt denies fever, ear pain, cough, difficulty urinating, seizures, syncope, diarrhea and rash as associated symptoms.Pt has a h/o GERD and peptic ulcers but is otherwise normally healthy. Pt denies a h/o of smoking and is a occasional social drinker.    Past Medical History  Diagnosis Date  . Bright red rectal bleeding     . GERD (gastroesophageal reflux disease)   . Peptic ulcer     Past Surgical History  Procedure Date  . None   . Colonoscopy     Family History  Problem Relation Age of Onset  . Colon cancer Neg Hx   . Liver disease Neg Hx   . Cancer Mother     deceased age 57, ?lung    History  Substance Use Topics  . Smoking status: Former Smoker    Types: Cigars    Quit date: 09/29/2006  . Smokeless tobacco: Not on file   Comment: smoked cigars 2 daily for 8 years  . Alcohol Use: No     socially, one beer on weekend      Review of Systems  Constitutional: Negative for fever.  HENT: Positive for congestion. Negative for ear pain and sore throat.   Respiratory: Negative for cough.   Cardiovascular: Positive for chest pain (sharp pain with deep breath then gets dull).  Gastrointestinal: Negative for vomiting and diarrhea.  Genitourinary: Negative for difficulty urinating.  Skin: Negative for rash.  Neurological: Negative for seizures and syncope.    Allergies  Aspirin  Home Medications   Current Outpatient Rx  Name Route Sig Dispense Refill  . ACETAMINOPHEN 500 MG PO TABS Oral Take 1,000 mg by mouth once as needed. For pain    . OMEPRAZOLE 20 MG PO CPDR Oral Take 1 capsule (20 mg total) by mouth daily before breakfast. 30 capsule 11    Triage Vitals: BP  139/73  Pulse 76  Temp(Src) 97.9 F (36.6 C) (Oral)  Resp 16  Ht 5\' 10"  (1.778 m)  Wt 285 lb (129.275 kg)  BMI 40.89 kg/m2  SpO2 96%  Physical Exam  Nursing note and vitals reviewed. Constitutional: He is oriented to person, place, and time. He appears well-developed and well-nourished.  HENT:  Head: Normocephalic and atraumatic.  Eyes: Conjunctivae and EOM are normal. Pupils are equal, round, and reactive to light.  Neck: Normal range of motion. Neck supple.  Cardiovascular: Normal rate, regular rhythm and normal heart sounds.   Pulmonary/Chest: Effort normal and breath sounds normal. He exhibits no tenderness.   Abdominal: Soft. Bowel sounds are normal.  Musculoskeletal: Normal range of motion. He exhibits no edema.  Neurological: He is alert and oriented to person, place, and time.  Skin: Skin is warm and dry.  Psychiatric: He has a normal mood and affect. His behavior is normal.    ED Course  Procedures (including critical care time) DIAGNOSTIC STUDIES: Oxygen Saturation is 96% on room air, adequate by my interpretation.    COORDINATION OF CARE:  9:17PM: discussed administering a chest xray, EKG and blood test to check for any abnormalities with pt and pt agreed.   Labs Reviewed  CBC - Abnormal; Notable for the following:    WBC 10.6 (*)    Hemoglobin 11.8 (*)    HCT 35.8 (*)    All other components within normal limits  BASIC METABOLIC PANEL  TROPONIN I   Dg Chest 2 View  11/26/2011  *RADIOLOGY REPORT*  Clinical Data: Chest pain.  CHEST - 2 VIEW  Comparison: None  Findings: The cardiac silhouette, mediastinal and hilar contours are within normal limits.  The lungs are clear.  No pleural effusion or pneumothorax.  The bony thorax is intact.  IMPRESSION: No acute cardiopulmonary findings.  Original Report Authenticated By: P. Loralie Champagne, M.D.   11:46 AM Lab workup is negative for serious causes of chest pain.  Pt reassured and released.   1. Atypical chest pain      I personally performed the services described in this documentation, which was scribed in my presence. The recorded information has been reviewed and considered.  Osvaldo Human, M.D.       Carleene Cooper III, MD 11/26/11 1150

## 2011-11-26 NOTE — Discharge Instructions (Signed)
Casey Williams, you had physical examination, laboratory tests, EKG, and chest x-ray to check on you for chest pain.  Fortunately, all of your tests were normal.  It is safe to go home.  You could take Tylenol or Advil if needed for this pain.

## 2011-11-26 NOTE — ED Notes (Signed)
Pt back from x-ray.

## 2011-11-26 NOTE — ED Provider Notes (Signed)
8:40 AM  Date: 11/26/2011  Rate: 70  Rhythm: normal sinus rhythm  QRS Axis: normal  Intervals: normal  ST/T Wave abnormalities: normal  Conduction Disutrbances:none  Narrative Interpretation: Normal EKG  Old EKG Reviewed: none available    Carleene Cooper III, MD 11/26/11 [phone]

## 2012-01-06 ENCOUNTER — Ambulatory Visit: Payer: Medicaid Other | Admitting: Gastroenterology

## 2012-01-06 ENCOUNTER — Telehealth: Payer: Self-pay | Admitting: Gastroenterology

## 2012-01-06 NOTE — Telephone Encounter (Signed)
Pt was a no show

## 2012-01-06 NOTE — Telephone Encounter (Signed)
REVIEWED.  

## 2013-06-26 ENCOUNTER — Encounter (HOSPITAL_COMMUNITY): Payer: Self-pay | Admitting: Emergency Medicine

## 2013-06-26 ENCOUNTER — Emergency Department (HOSPITAL_COMMUNITY)
Admission: EM | Admit: 2013-06-26 | Discharge: 2013-06-26 | Disposition: A | Discharge status: Home / Self Care | Payer: Medicaid Other | Attending: Emergency Medicine | Admitting: Emergency Medicine

## 2013-06-26 DIAGNOSIS — Z87891 Personal history of nicotine dependence: Secondary | ICD-10-CM | POA: Insufficient documentation

## 2013-06-26 DIAGNOSIS — Z8711 Personal history of peptic ulcer disease: Secondary | ICD-10-CM | POA: Insufficient documentation

## 2013-06-26 DIAGNOSIS — K5289 Other specified noninfective gastroenteritis and colitis: Secondary | ICD-10-CM | POA: Insufficient documentation

## 2013-06-26 DIAGNOSIS — K529 Noninfective gastroenteritis and colitis, unspecified: Secondary | ICD-10-CM

## 2013-06-26 DIAGNOSIS — R5381 Other malaise: Secondary | ICD-10-CM | POA: Insufficient documentation

## 2013-06-26 LAB — CBC WITH DIFFERENTIAL/PLATELET
Basophils Absolute: 0 10*3/uL (ref 0.0–0.1)
Basophils Relative: 0 % (ref 0–1)
Eosinophils Absolute: 0.4 10*3/uL (ref 0.0–0.7)
HCT: 39.3 % (ref 39.0–52.0)
Hemoglobin: 12.7 g/dL — ABNORMAL LOW (ref 13.0–17.0)
MCH: 26.6 pg (ref 26.0–34.0)
MCHC: 32.3 g/dL (ref 30.0–36.0)
Monocytes Absolute: 0.8 10*3/uL (ref 0.1–1.0)
Monocytes Relative: 6 % (ref 3–12)
Neutrophils Relative %: 77 % (ref 43–77)
RDW: 13.6 % (ref 11.5–15.5)

## 2013-06-26 LAB — COMPREHENSIVE METABOLIC PANEL
ALT: 32 U/L (ref 0–53)
AST: 38 U/L — ABNORMAL HIGH (ref 0–37)
Alkaline Phosphatase: 73 U/L (ref 39–117)
CO2: 27 mEq/L (ref 19–32)
Calcium: 9.7 mg/dL (ref 8.4–10.5)
GFR calc Af Amer: >90 mL/min (ref >90)
GFR calc non Af Amer: >90 mL/min (ref >90)
Glucose, Bld: 193 mg/dL — ABNORMAL HIGH (ref 70–99)
Potassium: 4.9 mEq/L (ref 3.5–5.1)
Sodium: 135 mEq/L (ref 135–145)

## 2013-06-26 MED ORDER — ONDANSETRON HCL 4 MG/2ML IJ SOLN
4.0000 mg | Freq: Once | INTRAMUSCULAR | Status: AC
  Administered 2013-06-26: 4 mg via INTRAVENOUS
  Filled 2013-06-26: qty 2

## 2013-06-26 MED ORDER — SODIUM CHLORIDE 0.9 % IV BOLUS (SEPSIS)
1000.0000 mL | Freq: Once | INTRAVENOUS | Status: AC
  Administered 2013-06-26: 1000 mL via INTRAVENOUS

## 2013-06-26 MED ORDER — OXYCODONE-ACETAMINOPHEN 5-325 MG PO TABS: 2.0000 | ORAL_TABLET | ORAL | Status: DC | PRN

## 2013-06-26 MED ORDER — ONDANSETRON 8 MG PO TBDP
8.0000 mg | ORAL_TABLET | Freq: Once | ORAL | Status: AC
  Administered 2013-06-26: 8 mg via ORAL
  Filled 2013-06-26: qty 1

## 2013-06-26 MED ORDER — PROMETHAZINE HCL 25 MG PO TABS: 25.0000 mg | ORAL_TABLET | Freq: Four times a day (QID) | ORAL | Status: DC | PRN

## 2013-06-26 NOTE — ED Provider Notes (Signed)
CSN: [id number]     Arrival date & time 06/26/13  2536 History   This chart was scribed for Casey Hutching, MD, by Yevette Edwards, ED Scribe. This patient was seen in room APA18/APA18 and the patient's care was started at 10:25 AM. First MD Initiated Contact with Patient 06/26/13 1022     Chief Complaint  Patient presents with  . Nausea  . Diarrhea   The history is provided by the patient. No language interpreter was used.    HPI Comments: Casey Williams is a 32 y.o. male who presents to the Emergency Department complaining of diffuse, acute abdominal pain which began four hours ago and is associated with nausea, several episodes of diarrhea, and generalized weakness. The pt has a h/o rectal bleeding due to a peptic ulcer; he reports improvements to the symptoms.   He is a former smoker. He denies drinking alcohol.  Past Medical History  Diagnosis Date  . Bright red rectal bleeding   . GERD (gastroesophageal reflux disease)   . Peptic ulcer    Past Surgical History  Procedure Laterality Date  . None    . Colonoscopy     Family History  Problem Relation Age of Onset  . Colon cancer Neg Hx   . Liver disease Neg Hx   . Cancer Mother     deceased age 11, ?lung   History  Substance Use Topics  . Smoking status: Former Smoker    Types: Cigars    Quit date: 09/29/2006  . Smokeless tobacco: Not on file     Comment: smoked cigars 2 daily for 8 years  . Alcohol Use: No     Comment: socially, one beer on weekend    Review of Systems  Constitutional: Negative for fever.  Gastrointestinal: Positive for nausea, abdominal pain and diarrhea. Negative for vomiting.  Neurological: Positive for weakness.  All other systems reviewed and are negative.    Allergies  Aspirin  Home Medications  No current outpatient prescriptions on file.  Triage Vitals: BP 155/89  Pulse 102  Temp(Src) 98.6 F (37 C) (Oral)  Resp 20  Ht 5\' 10"  (1.778 m)  Wt 270 lb (122.471 kg)  BMI 38.74 kg/m2   SpO2 96%  Physical Exam  Nursing note and vitals reviewed. Constitutional: He is oriented to person, place, and time. He appears well-developed and well-nourished.  HENT:  Head: Normocephalic and atraumatic.  Eyes: Conjunctivae and EOM are normal. Pupils are equal, round, and reactive to light.  Neck: Normal range of motion. Neck supple.  Cardiovascular: Normal rate, regular rhythm and normal heart sounds.   Pulmonary/Chest: Effort normal and breath sounds normal. No respiratory distress.  Abdominal: Soft. Bowel sounds are normal. There is tenderness.  Minimal epigastric tenderness.   Musculoskeletal: Normal range of motion.  Neurological: He is alert and oriented to person, place, and time.  Skin: Skin is warm and dry.  Psychiatric: He has a normal mood and affect. His behavior is normal.    ED Course  Procedures (including critical care time)  DIAGNOSTIC STUDIES: Oxygen Saturation is 96% on room air, normal by my interpretation.    COORDINATION OF CARE:  10:27 AM- Discussed treatment plan with patient, and the patient agreed to the plan.   Labs Review Labs Reviewed  COMPREHENSIVE METABOLIC PANEL - Abnormal; Notable for the following:    Glucose, Bld 193 (*)    Total Protein 8.6 (*)    AST 38 (*)    All other components within  normal limits  CBC WITH DIFFERENTIAL - Abnormal; Notable for the following:    WBC 12.9 (*)    Hemoglobin 12.7 (*)    Neutro Abs 9.9 (*)    All other components within normal limits  LACTIC ACID, PLASMA   Imaging Review No results found.  EKG Interpretation   None       MDM  No diagnosis found. No acute abdomen. Patient feels better after IV fluids and pain management. Discharge medications Percocet and Phenergan 25 mg.   Possibility of appendicitis discussed. Patient will return if pain goes to right lower abdomen.  I personally performed the services described in this documentation, which was scribed in my presence. The recorded  information has been reviewed and is accurate.    Casey Hutching, MD 06/26/13 1248

## 2013-06-26 NOTE — ED Notes (Signed)
Upper abd pain with n/d starting this morning.  Denies vomiting.

## 2014-09-20 ENCOUNTER — Emergency Department (HOSPITAL_COMMUNITY): Payer: BLUE CROSS/BLUE SHIELD

## 2014-09-20 ENCOUNTER — Emergency Department (HOSPITAL_COMMUNITY)
Admission: EM | Admit: 2014-09-20 | Discharge: 2014-09-20 | Disposition: A | Discharge status: Home / Self Care | Payer: BLUE CROSS/BLUE SHIELD | Attending: Emergency Medicine | Admitting: Emergency Medicine

## 2014-09-20 ENCOUNTER — Encounter (HOSPITAL_COMMUNITY): Payer: Self-pay | Admitting: Emergency Medicine

## 2014-09-20 DIAGNOSIS — Z8711 Personal history of peptic ulcer disease: Secondary | ICD-10-CM | POA: Insufficient documentation

## 2014-09-20 DIAGNOSIS — R079 Chest pain, unspecified: Secondary | ICD-10-CM | POA: Insufficient documentation

## 2014-09-20 DIAGNOSIS — Z87891 Personal history of nicotine dependence: Secondary | ICD-10-CM | POA: Diagnosis not present

## 2014-09-20 DIAGNOSIS — Z79899 Other long term (current) drug therapy: Secondary | ICD-10-CM | POA: Insufficient documentation

## 2014-09-20 DIAGNOSIS — K219 Gastro-esophageal reflux disease without esophagitis: Secondary | ICD-10-CM | POA: Diagnosis not present

## 2014-09-20 HISTORY — DX: Type 2 diabetes mellitus without complications: E11.9

## 2014-09-20 LAB — BASIC METABOLIC PANEL
Anion gap: 9 (ref 5–15)
BUN: 11 mg/dL (ref 6–23)
CALCIUM: 8.9 mg/dL (ref 8.4–10.5)
CHLORIDE: 101 mmol/L (ref 96–112)
CO2: 25 mmol/L (ref 19–32)
Creatinine, Ser: 0.74 mg/dL (ref 0.50–1.35)
GFR calc Af Amer: >90 mL/min (ref >90)
GFR calc non Af Amer: >90 mL/min (ref >90)
Glucose, Bld: 284 mg/dL — ABNORMAL HIGH (ref 70–99)
POTASSIUM: 4 mmol/L (ref 3.5–5.1)
SODIUM: 135 mmol/L (ref 135–145)

## 2014-09-20 LAB — CBC WITH DIFFERENTIAL/PLATELET
BASOS ABS: 0 10*3/uL (ref 0.0–0.1)
Basophils Relative: 0 % (ref 0–1)
Eosinophils Absolute: 0.2 10*3/uL (ref 0.0–0.7)
Eosinophils Relative: 2 % (ref 0–5)
HCT: 36.2 % — ABNORMAL LOW (ref 39.0–52.0)
Hemoglobin: 12.4 g/dL — ABNORMAL LOW (ref 13.0–17.0)
Lymphocytes Relative: 34 % (ref 12–46)
Lymphs Abs: 3.4 10*3/uL (ref 0.7–4.0)
MCH: 27.9 pg (ref 26.0–34.0)
MCHC: 34.3 g/dL (ref 30.0–36.0)
MCV: 81.3 fL (ref 78.0–100.0)
MONOS PCT: 5 % (ref 3–12)
Monocytes Absolute: 0.5 10*3/uL (ref 0.1–1.0)
Neutro Abs: 5.7 10*3/uL (ref 1.7–7.7)
Neutrophils Relative %: 59 % (ref 43–77)
PLATELETS: 256 10*3/uL (ref 150–400)
RBC: 4.45 MIL/uL (ref 4.22–5.81)
RDW: 14.3 % (ref 11.5–15.5)
WBC: 9.8 10*3/uL (ref 4.0–10.5)

## 2014-09-20 LAB — TROPONIN I

## 2014-09-20 MED ORDER — PANTOPRAZOLE SODIUM 40 MG PO TBEC: 40.0000 mg | DELAYED_RELEASE_TABLET | Freq: Every day | ORAL | Status: DC

## 2014-09-20 MED ORDER — GI COCKTAIL ~~LOC~~
30.0000 mL | Freq: Once | ORAL | Status: AC
  Administered 2014-09-20: 30 mL via ORAL
  Filled 2014-09-20: qty 30

## 2014-09-20 NOTE — ED Provider Notes (Signed)
CSN: [id number]     Arrival date & time 09/20/14  0404 History   First MD Initiated Contact with Patient 09/20/14 0407     Chief Complaint  Patient presents with  . Chest Pain     (Consider location/radiation/quality/duration/timing/severity/associated sxs/prior Treatment) The history is provided by the patient.   34 year old male was awakened by a midsternal chest discomfort. He states that it feels like his acid reflux. Discomfort will be there for about 30 seconds before resolving and then recur. There is no radiation of discomfort. There is associated diaphoresis but no dyspnea or nausea. He has not taken anything at home for the discomfort. He does have cardiac risk factor of diabetes but there is no history of tobacco abuse, hypertension, hyperlipidemia, or family history of premature coronary atherosclerosis. Of note, he is aspirin allergic.   Past Medical History  Diagnosis Date  . Bright red rectal bleeding   . GERD (gastroesophageal reflux disease)   . Peptic ulcer    Past Surgical History  Procedure Laterality Date  . None    . Colonoscopy     Family History  Problem Relation Age of Onset  . Colon cancer Neg Hx   . Liver disease Neg Hx   . Cancer Mother     deceased age 35, ?lung   History  Substance Use Topics  . Smoking status: Former Smoker    Types: Cigars    Quit date: 09/29/2006  . Smokeless tobacco: Not on file     Comment: smoked cigars 2 daily for 8 years  . Alcohol Use: No     Comment: socially, one beer on weekend    Review of Systems  All other systems reviewed and are negative.     Allergies  Aspirin  Home Medications   Prior to Admission medications   Medication Sig Start Date End Date Taking? Authorizing Provider  oxyCODONE-acetaminophen (PERCOCET) 5-325 MG per tablet Take 2 tablets by mouth every 4 (four) hours as needed. 06/26/13   Donnetta HutchingBrian Cook, MD  promethazine (PHENERGAN) 25 MG tablet Take 1 tablet (25 mg total) by mouth every 6  (six) hours as needed for nausea. 05/27/11 06/03/11  Bethann BerkshireJoseph Zammit, MD  promethazine (PHENERGAN) 25 MG tablet Take 1 tablet (25 mg total) by mouth every 6 (six) hours as needed for nausea or vomiting. 06/26/13   Donnetta HutchingBrian Cook, MD   There were no vitals taken for this visit. Physical Exam  Nursing note and vitals reviewed.  34 year old male, resting comfortably and in no acute distress. Vital signs are significant for hypertension. Oxygen saturation is 99%, which is normal. Head is normocephalic and atraumatic. PERRLA, EOMI. Oropharynx is clear. Neck is nontender and supple without adenopathy or JVD. Back is nontender and there is no CVA tenderness. Lungs are clear without rales, wheezes, or rhonchi. Chest is nontender. Heart has regular rate and rhythm without murmur. Abdomen is soft, flat, with mild epigastric tenderness. There is no rebound or guarding. There are no masses or hepatosplenomegaly and peristalsis is normoactive. Extremities have no cyanosis or edema, full range of motion is present. Skin is warm and dry without rash. Neurologic: Mental status is normal, cranial nerves are intact, there are no motor or sensory deficits.   ED Course  Procedures (including critical care time) Labs Review Results for orders placed or performed during the hospital encounter of 09/20/14  Basic metabolic panel  Result Value Ref Range   Sodium 135 135 - 145 mmol/L   Potassium 4.0  3.5 - 5.1 mmol/L   Chloride 101 96 - 112 mmol/L   CO2 25 19 - 32 mmol/L   Glucose, Bld 284 (H) 70 - 99 mg/dL   BUN 11 6 - 23 mg/dL   Creatinine, Ser 1.61 0.50 - 1.35 mg/dL   Calcium 8.9 8.4 - 09.6 mg/dL   GFR calc non Af Amer >90 >90 mL/min   GFR calc Af Amer >90 >90 mL/min   Anion gap 9 5 - 15  CBC with Differential  Result Value Ref Range   WBC 9.8 4.0 - 10.5 K/uL   RBC 4.45 4.22 - 5.81 MIL/uL   Hemoglobin 12.4 (L) 13.0 - 17.0 g/dL   HCT 04.5 (L) 40.9 - 81.1 %   MCV 81.3 78.0 - 100.0 fL   MCH 27.9 26.0 -  34.0 pg   MCHC 34.3 30.0 - 36.0 g/dL   RDW 91.4 78.2 - 95.6 %   Platelets 256 150 - 400 K/uL   Neutrophils Relative % 59 43 - 77 %   Neutro Abs 5.7 1.7 - 7.7 K/uL   Lymphocytes Relative 34 12 - 46 %   Lymphs Abs 3.4 0.7 - 4.0 K/uL   Monocytes Relative 5 3 - 12 %   Monocytes Absolute 0.5 0.1 - 1.0 K/uL   Eosinophils Relative 2 0 - 5 %   Eosinophils Absolute 0.2 0.0 - 0.7 K/uL   Basophils Relative 0 0 - 1 %   Basophils Absolute 0.0 0.0 - 0.1 K/uL  Troponin I  Result Value Ref Range   Troponin I <0.03 <0.031 ng/mL    Imaging Review Dg Chest Port 1 View  09/20/2014   CLINICAL DATA:  Chest tightness.  Awoke from sleep sweating.  EXAM: PORTABLE CHEST - 1 VIEW  COMPARISON:  Frontal and lateral views 11/16/2011  FINDINGS: The heart is at the upper limits of normal, likely accentuated by portable technique. Pulmonary vasculature is normal. No consolidation, pleural effusion, or pneumothorax. No acute osseous abnormalities are seen. Detailed evaluation limited by soft tissue attenuation from body habitus.  IMPRESSION: Heart at the upper limits of normal in size, likely accentuated by technique. No acute process.   Electronically Signed   By: Rubye Oaks M.D.   On: 09/20/2014 05:13     EKG Interpretation   Date/Time:  Thursday September 20 2014 04:16:54 EST Ventricular Rate:  93 PR Interval:  182 QRS Duration: 95 QT Interval:  381 QTC Calculation: 474 R Axis:   -17 Text Interpretation:  Sinus rhythm Borderline left axis deviation  Borderline low voltage, extremity leads Anteroseptal infarct, old Minimal  ST elevation, inferior leads Borderline prolonged QT interval When  compared with ECG of 11/26/2011, No significant change was found Confirmed  by [facility]  MD, Bettyjane Shenoy ([id number]) on 09/20/2014 4:40:42 AM      MDM   Final diagnoses:  Chest pain, unspecified chest pain type  GERD without esophagitis    Chest pain which seems most likely to be due to gastroesophageal reflux disease. He  does have risk factor of diabetes but no other risk factors. Thank that pain only last for 30 seconds is reassuring. ECG is unremarkable. He has not given aspirin because of allergy. Laboratory workup is unremarkable. Following GI cocktail, pain has gone away and has not recurred. He'll be treated for gastroesophageal reflux and is discharged with a prescription for pantoprazole. Follow-up with PCP.    Dione Booze, MD 09/20/14 [phone]

## 2014-09-20 NOTE — ED Notes (Signed)
Pt c/o chest tightness that he woke up with and sweating.

## 2014-09-20 NOTE — Discharge Instructions (Signed)
Chest Pain (Nonspecific) °It is often hard to give a specific diagnosis for the cause of chest pain. There is always a chance that your pain could be related to something serious, such as a heart attack or a blood clot in the lungs. You need to follow up with your health care provider for further evaluation. °CAUSES  °· Heartburn. °· Pneumonia or bronchitis. °· Anxiety or stress. °· Inflammation around your heart (pericarditis) or lung (pleuritis or pleurisy). °· A blood clot in the lung. °· A collapsed lung (pneumothorax). It can develop suddenly on its own (spontaneous pneumothorax) or from trauma to the chest. °· Shingles infection (herpes zoster virus). °The chest wall is composed of bones, muscles, and cartilage. Any of these can be the source of the pain. °· The bones can be bruised by injury. °· The muscles or cartilage can be strained by coughing or overwork. °· The cartilage can be affected by inflammation and become sore (costochondritis). °DIAGNOSIS  °Lab tests or other studies may be needed to find the cause of your pain. Your health care provider may have you take a test called an ambulatory electrocardiogram (ECG). An ECG records your heartbeat patterns over a 24-hour period. You may also have other tests, such as: °· Transthoracic echocardiogram (TTE). During echocardiography, sound waves are used to evaluate how blood flows through your heart. °· Transesophageal echocardiogram (TEE). °· Cardiac monitoring. This allows your health care provider to monitor your heart rate and rhythm in real time. °· Holter monitor. This is a portable device that records your heartbeat and can help diagnose heart arrhythmias. It allows your health care provider to track your heart activity for several days, if needed. °· Stress tests by exercise or by giving medicine that makes the heart beat faster. °TREATMENT  °· Treatment depends on what may be causing your chest pain. Treatment may include: °· Acid blockers for  heartburn. °· Anti-inflammatory medicine. °· Pain medicine for inflammatory conditions. °· Antibiotics if an infection is present. °· You may be advised to change lifestyle habits. This includes stopping smoking and avoiding alcohol, caffeine, and chocolate. °· You may be advised to keep your head raised (elevated) when sleeping. This reduces the chance of acid going backward from your stomach into your esophagus. °Most of the time, nonspecific chest pain will improve within 2-3 days with rest and mild pain medicine.  °HOME CARE INSTRUCTIONS  °· If antibiotics were prescribed, take them as directed. Finish them even if you start to feel better. °· For the next few days, avoid physical activities that bring on chest pain. Continue physical activities as directed. °· Do not use any tobacco products, including cigarettes, chewing tobacco, or electronic cigarettes. °· Avoid drinking alcohol. °· Only take medicine as directed by your health care provider. °· Follow your health care provider's suggestions for further testing if your chest pain does not go away. °· Keep any follow-up appointments you made. If you do not go to an appointment, you could develop lasting (chronic) problems with pain. If there is any problem keeping an appointment, call to reschedule. °SEEK MEDICAL CARE IF:  °· Your chest pain does not go away, even after treatment. °· You have a rash with blisters on your chest. °· You have a fever. °SEEK IMMEDIATE MEDICAL CARE IF:  °· You have increased chest pain or pain that spreads to your arm, neck, jaw, back, or abdomen. °· You have shortness of breath. °· You have an increasing cough, or you cough   up blood. °· You have severe back or abdominal pain. °· You feel nauseous or vomit. °· You have severe weakness. °· You faint. °· You have chills. °This is an emergency. Do not wait to see if the pain will go away. Get medical help at once. Call your local emergency services (911 in [place] Do not drive  yourself to the hospital. °MAKE SURE YOU:  °· Understand these instructions. °· Will watch your condition. °· Will get help right away if you are not doing well or get worse. °Document Released: 04/08/2005 Document Revised: 07/04/2013 Document Reviewed: 02/02/2008 °ExitCare® Patient Information ©2015 [employer]. This information is not intended to replace advice given to you by your health care provider. Make sure you discuss any questions you have with your health care provider. ° °Gastroesophageal Reflux Disease, Adult °Gastroesophageal reflux disease (GERD) happens when acid from your stomach flows up into the esophagus. When acid comes in contact with the esophagus, the acid causes soreness (inflammation) in the esophagus. Over time, GERD may create small holes (ulcers) in the lining of the esophagus. °CAUSES  °· Increased body weight. This puts pressure on the stomach, making acid rise from the stomach into the esophagus. °· Smoking. This increases acid production in the stomach. °· Drinking alcohol. This causes decreased pressure in the lower esophageal sphincter (valve or ring of muscle between the esophagus and stomach), allowing acid from the stomach into the esophagus. °· Late evening meals and a full stomach. This increases pressure and acid production in the stomach. °· A malformed lower esophageal sphincter. °Sometimes, no cause is found. °SYMPTOMS  °· Burning pain in the lower part of the mid-chest behind the breastbone and in the mid-stomach area. This may occur twice a week or more often. °· Trouble swallowing. °· Sore throat. °· Dry cough. °· Asthma-like symptoms including chest tightness, shortness of breath, or wheezing. °DIAGNOSIS  °Your caregiver may be able to diagnose GERD based on your symptoms. In some cases, X-rays and other tests may be done to check for complications or to check the condition of your stomach and esophagus. °TREATMENT  °Your caregiver may recommend over-the-counter or  prescription medicines to help decrease acid production. Ask your caregiver before starting or adding any new medicines.  °HOME CARE INSTRUCTIONS  °· Change the factors that you can control. Ask your caregiver for guidance concerning weight loss, quitting smoking, and alcohol consumption. °· Avoid foods and drinks that make your symptoms worse, such as: °¨ Caffeine or alcoholic drinks. °¨ Chocolate. °¨ Peppermint or mint flavorings. °¨ Garlic and onions. °¨ Spicy foods. °¨ Citrus fruits, such as oranges, lemons, or limes. °¨ Tomato-based foods such as sauce, chili, salsa, and pizza. °¨ Fried and fatty foods. °· Avoid lying down for the 3 hours prior to your bedtime or prior to taking a nap. °· Eat small, frequent meals instead of large meals. °· Wear loose-fitting clothing. Do not wear anything tight around your waist that causes pressure on your stomach. °· Raise the head of your bed 6 to 8 inches with wood blocks to help you sleep. Extra pillows will not help. °· Only take over-the-counter or prescription medicines for pain, discomfort, or fever as directed by your caregiver. °· Do not take aspirin, ibuprofen, or other nonsteroidal anti-inflammatory drugs (NSAIDs). °SEEK IMMEDIATE MEDICAL CARE IF:  °· You have pain in your arms, neck, jaw, teeth, or back. °· Your pain increases or changes in intensity or duration. °· You develop nausea, vomiting, or sweating (  diaphoresis). °· You develop shortness of breath, or you faint. °· Your vomit is green, yellow, black, or looks like coffee grounds or blood. °· Your stool is red, bloody, or black. °These symptoms could be signs of other problems, such as heart disease, gastric bleeding, or esophageal bleeding. °MAKE SURE YOU:  °· Understand these instructions. °· Will watch your condition. °· Will get help right away if you are not doing well or get worse. °Document Released: 04/08/2005 Document Revised: 09/21/2011 Document Reviewed: 01/16/2011 °ExitCare® Patient  Information ©2015 [employer]. This information is not intended to replace advice given to you by your health care provider. Make sure you discuss any questions you have with your health care provider. ° °Pantoprazole tablets °What is this medicine? °PANTOPRAZOLE (pan TOE pra zole) prevents the production of acid in the stomach. It is used to treat gastroesophageal reflux disease (GERD), inflammation of the esophagus, and Zollinger-Ellison syndrome. °This medicine may be used for other purposes; ask your health care provider or pharmacist if you have questions. °COMMON BRAND NAME(S): Protonix °What should I tell my health care provider before I take this medicine? °They need to know if you have any of these conditions: °-liver disease °-low levels of magnesium in the blood °-an unusual or allergic reaction to omeprazole, lansoprazole, pantoprazole, rabeprazole, other medicines, foods, dyes, or preservatives °-pregnant or trying to get pregnant °-breast-feeding °How should I use this medicine? °Take this medicine by mouth. Swallow the tablets whole with a drink of water. Follow the directions on the prescription label. Do not crush, break, or chew. Take your medicine at regular intervals. Do not take your medicine more often than directed. °Talk to your pediatrician regarding the use of this medicine in children. While this drug may be prescribed for children as young as 5 years for selected conditions, precautions do apply. °Overdosage: If you think you have taken too much of this medicine contact a poison control center or emergency room at once. °NOTE: This medicine is only for you. Do not share this medicine with others. °What if I miss a dose? °If you miss a dose, take it as soon as you can. If it is almost time for your next dose, take only that dose. Do not take double or extra doses. °What may interact with this medicine? °Do not take this medicine with any of the following  medications: °-atazanavir °-nelfinavir °This medicine may also interact with the following medications: °-ampicillin °-delavirdine °-digoxin °-diuretics °-iron salts °-medicines for fungal infections like ketoconazole, itraconazole and voriconazole °-warfarin °This list may not describe all possible interactions. Give your health care provider a list of all the medicines, herbs, non-prescription drugs, or dietary supplements you use. Also tell them if you smoke, drink alcohol, or use illegal drugs. Some items may interact with your medicine. °What should I watch for while using this medicine? °It can take several days before your stomach pain gets better. Check with your doctor or health care professional if your condition does not start to get better, or if it gets worse. °You may need blood work done while you are taking this medicine. °What side effects may I notice from receiving this medicine? °Side effects that you should report to your doctor or health care professional as soon as possible: °-allergic reactions like skin rash, itching or hives, swelling of the face, lips, or tongue °-bone, muscle or joint pain °-breathing problems °-chest pain or chest tightness °-dark yellow or Demir urine °-dizziness °-fast, irregular heartbeat °-feeling faint   or lightheaded °-fever or sore throat °-muscle spasm °-palpitations °-redness, blistering, peeling or loosening of the skin, including inside the mouth °-seizures °-tremors °-unusual bleeding or bruising °-unusually weak or tired °-yellowing of the eyes or skin °Side effects that usually do not require medical attention (Report these to your doctor or health care professional if they continue or are bothersome.): °-constipation °-diarrhea °-dry mouth °-headache °-nausea °This list may not describe all possible side effects. Call your doctor for medical advice about side effects. You may report side effects to FDA at 1-800-FDA-1088. °Where should I keep my  medicine? °Keep out of the reach of children. °Store at room temperature between 15 and 30 degrees C (59 and 86 degrees F). Protect from light and moisture. Throw away any unused medicine after the expiration date. °NOTE: This sheet is a summary. It may not cover all possible information. If you have questions about this medicine, talk to your doctor, pharmacist, or health care provider. °© 2015, [employer]. (2012-04-27 16:40:16) ° °

## 2014-10-24 DIAGNOSIS — K219 Gastro-esophageal reflux disease without esophagitis: Secondary | ICD-10-CM | POA: Diagnosis not present

## 2014-10-24 DIAGNOSIS — Z87891 Personal history of nicotine dependence: Secondary | ICD-10-CM | POA: Insufficient documentation

## 2014-10-24 DIAGNOSIS — J020 Streptococcal pharyngitis: Secondary | ICD-10-CM | POA: Diagnosis not present

## 2014-10-24 DIAGNOSIS — E119 Type 2 diabetes mellitus without complications: Secondary | ICD-10-CM | POA: Diagnosis not present

## 2014-10-24 DIAGNOSIS — Z79899 Other long term (current) drug therapy: Secondary | ICD-10-CM | POA: Insufficient documentation

## 2014-10-24 DIAGNOSIS — J029 Acute pharyngitis, unspecified: Secondary | ICD-10-CM | POA: Diagnosis present

## 2014-10-25 ENCOUNTER — Emergency Department (HOSPITAL_COMMUNITY)
Admission: EM | Admit: 2014-10-25 | Discharge: 2014-10-25 | Disposition: A | Discharge status: Home / Self Care | Payer: BLUE CROSS/BLUE SHIELD | Attending: Emergency Medicine | Admitting: Emergency Medicine

## 2014-10-25 ENCOUNTER — Encounter (HOSPITAL_COMMUNITY): Payer: Self-pay

## 2014-10-25 DIAGNOSIS — J020 Streptococcal pharyngitis: Secondary | ICD-10-CM

## 2014-10-25 LAB — RAPID STREP SCREEN (MED CTR MEBANE ONLY): Streptococcus, Group A Screen (Direct): POSITIVE — AB

## 2014-10-25 MED ORDER — ACETAMINOPHEN 500 MG PO TABS
1000.0000 mg | ORAL_TABLET | Freq: Once | ORAL | Status: AC
  Administered 2014-10-25: 1000 mg via ORAL

## 2014-10-25 MED ORDER — IBUPROFEN 400 MG PO TABS
600.0000 mg | ORAL_TABLET | Freq: Once | ORAL | Status: AC
  Administered 2014-10-25: 600 mg via ORAL
  Filled 2014-10-25: qty 2

## 2014-10-25 MED ORDER — PENICILLIN G BENZATHINE 1200000 UNIT/2ML IM SUSP
2.4000 10*6.[IU] | Freq: Once | INTRAMUSCULAR | Status: AC
  Administered 2014-10-25: 2.4 10*6.[IU] via INTRAMUSCULAR
  Filled 2014-10-25: qty 4

## 2014-10-25 MED ORDER — ACETAMINOPHEN 500 MG PO TABS
ORAL_TABLET | ORAL | Status: AC
  Filled 2014-10-25: qty 2

## 2014-10-25 MED ORDER — IBUPROFEN 600 MG PO TABS: 600.0000 mg | ORAL_TABLET | Freq: Four times a day (QID) | ORAL | Status: DC | PRN

## 2014-10-25 NOTE — Discharge Instructions (Signed)
Please take motrin for sore throat. Push lots of fluids and stay hydrated.   Pharyngitis Pharyngitis is redness, pain, and swelling (inflammation) of your pharynx.  CAUSES  Pharyngitis is usually caused by infection. Most of the time, these infections are from viruses (viral) and are part of a cold. However, sometimes pharyngitis is caused by bacteria (bacterial). Pharyngitis can also be caused by allergies. Viral pharyngitis may be spread from person to person by coughing, sneezing, and personal items or utensils (cups, forks, spoons, toothbrushes). Bacterial pharyngitis may be spread from person to person by more intimate contact, such as kissing.  SIGNS AND SYMPTOMS  Symptoms of pharyngitis include:   Sore throat.   Tiredness (fatigue).   Low-grade fever.   Headache.  Joint pain and muscle aches.  Skin rashes.  Swollen lymph nodes.  Plaque-like film on throat or tonsils (often seen with bacterial pharyngitis). DIAGNOSIS  Your health care provider will ask you questions about your illness and your symptoms. Your medical history, along with a physical exam, is often all that is needed to diagnose pharyngitis. Sometimes, a rapid strep test is done. Other lab tests may also be done, depending on the suspected cause.  TREATMENT  Viral pharyngitis will usually get better in 3-4 days without the use of medicine. Bacterial pharyngitis is treated with medicines that kill germs (antibiotics).  HOME CARE INSTRUCTIONS   Drink enough water and fluids to keep your urine clear or pale yellow.   Only take over-the-counter or prescription medicines as directed by your health care provider:   If you are prescribed antibiotics, make sure you finish them even if you start to feel better.   Do not take aspirin.   Get lots of rest.   Gargle with 8 oz of salt water ( tsp of salt per 1 qt of water) as often as every 1-2 hours to soothe your throat.   Throat lozenges (if you are not  at risk for choking) or sprays may be used to soothe your throat. SEEK MEDICAL CARE IF:   You have large, tender lumps in your neck.  You have a rash.  You cough up green, yellow-[place], or bloody spit. SEEK IMMEDIATE MEDICAL CARE IF:   Your neck becomes stiff.  You drool or are unable to swallow liquids.  You vomit or are unable to keep medicines or liquids down.  You have severe pain that does not go away with the use of recommended medicines.  You have trouble breathing (not caused by a stuffy nose). MAKE SURE YOU:   Understand these instructions.  Will watch your condition.  Will get help right away if you are not doing well or get worse. Document Released: 06/29/2005 Document Revised: 04/19/2013 Document Reviewed: 03/06/2013 [facility]ExitCare Patient Information 2015 [employer], [place]. This information is not intended to replace advice given to you by your health care provider. Make sure you discuss any questions you have with your health care provider.

## 2014-10-25 NOTE — ED Provider Notes (Signed)
CSN: [id number]     Arrival date & time 10/24/14  2326 History   First MD Initiated Contact with Patient 10/25/14 0157     Chief Complaint  Patient presents with  . Sore Throat     (Consider location/radiation/quality/duration/timing/severity/associated sxs/prior Treatment) Patient is a 34 y.o. male presenting with pharyngitis. The history is provided by the patient.  Sore Throat This is a new problem. The current episode started 12 to 24 hours ago. The problem occurs constantly. The problem has been gradually worsening. Pertinent negatives include no chest pain. The symptoms are aggravated by drinking, eating and swallowing. He has tried nothing for the symptoms.    Past Medical History  Diagnosis Date  . Bright red rectal bleeding   . GERD (gastroesophageal reflux disease)   . Peptic ulcer   . Diabetes mellitus without complication    Past Surgical History  Procedure Laterality Date  . None    . Colonoscopy     Family History  Problem Relation Age of Onset  . Colon cancer Neg Hx   . Liver disease Neg Hx   . Cancer Mother     deceased age 65, ?lung   History  Substance Use Topics  . Smoking status: Former Smoker    Types: Cigars    Quit date: 09/29/2006  . Smokeless tobacco: Not on file     Comment: smoked cigars 2 daily for 8 years  . Alcohol Use: No     Comment: socially, one beer on weekend    Review of Systems  Constitutional: Positive for fever. Negative for activity change.  HENT: Positive for sore throat. Negative for congestion, ear pain, facial swelling and voice change.   Respiratory: Negative for cough.   Cardiovascular: Negative for chest pain.  Gastrointestinal: Negative for nausea.      Allergies  Aspirin  Home Medications   Prior to Admission medications   Medication Sig Start Date End Date Taking? Authorizing Provider  metFORMIN (GLUCOPHAGE) 500 MG tablet Take by mouth 2 (two) times daily with a meal.   Yes Historical Provider, MD   pantoprazole (PROTONIX) 40 MG tablet Take 1 tablet (40 mg total) by mouth daily. 09/20/14  Yes Dione Booze, MD  ibuprofen (ADVIL,MOTRIN) 600 MG tablet Take 1 tablet (600 mg total) by mouth every 6 (six) hours as needed. 10/25/14   Derwood Kaplan, MD  oxyCODONE-acetaminophen (PERCOCET) 5-325 MG per tablet Take 2 tablets by mouth every 4 (four) hours as needed. 06/26/13   Donnetta Hutching, MD  promethazine (PHENERGAN) 25 MG tablet Take 1 tablet (25 mg total) by mouth every 6 (six) hours as needed for nausea. 05/27/11 06/03/11  Bethann Berkshire, MD  promethazine (PHENERGAN) 25 MG tablet Take 1 tablet (25 mg total) by mouth every 6 (six) hours as needed for nausea or vomiting. 06/26/13   Donnetta Hutching, MD   BP 142/62 mmHg  Pulse 111  Temp(Src) 99.1 F (37.3 C) (Oral)  Resp 18  Ht  (1.778 m)  Wt 265 lb (120.203 kg)  BMI 38.02 kg/m2  SpO2 97% Physical Exam  Constitutional: He appears well-developed.  HENT:  Mouth/Throat: Oropharyngeal exudate present.  Eyes: Conjunctivae are normal.  Neck: Neck supple.  Cardiovascular: Normal rate.   Pulmonary/Chest: Effort normal.  Nursing note and vitals reviewed.   ED Course  Procedures (including critical care time) Labs Review Labs Reviewed  RAPID STREP SCREEN - Abnormal; Notable for the following:    Streptococcus, Group A Screen (Direct) POSITIVE (*)  All other components within normal limits    Imaging Review No results found.   EKG Interpretation None      MDM   Final diagnoses:  Strep pharyngitis    Pt has strep, IM penicillin given. PT will get motrin for pain. He has fever and tachycardia - but he is healthy, and the source is known - no needs for labs or ivf. Will d/c.    Derwood KaplanAnkit Zelda Reames, MD 10/25/14 [phone]

## 2014-10-25 NOTE — ED Notes (Signed)
Per dr Talbot Grumblingnanavti stated to give only one injection of penicillin.

## 2014-10-25 NOTE — ED Notes (Signed)
Pt c/o sore throat.  Pt reports his daughter just diagnosed with strep today and is taking antibiotics

## 2015-06-22 ENCOUNTER — Encounter (HOSPITAL_COMMUNITY): Payer: Self-pay | Admitting: *Deleted

## 2015-06-22 ENCOUNTER — Emergency Department (HOSPITAL_COMMUNITY)
Admission: EM | Admit: 2015-06-22 | Discharge: 2015-06-22 | Disposition: A | Discharge status: Home / Self Care | Payer: BLUE CROSS/BLUE SHIELD | Attending: Emergency Medicine | Admitting: Emergency Medicine

## 2015-06-22 DIAGNOSIS — Z79899 Other long term (current) drug therapy: Secondary | ICD-10-CM | POA: Insufficient documentation

## 2015-06-22 DIAGNOSIS — R197 Diarrhea, unspecified: Secondary | ICD-10-CM | POA: Diagnosis not present

## 2015-06-22 DIAGNOSIS — Z8711 Personal history of peptic ulcer disease: Secondary | ICD-10-CM | POA: Insufficient documentation

## 2015-06-22 DIAGNOSIS — K219 Gastro-esophageal reflux disease without esophagitis: Secondary | ICD-10-CM | POA: Diagnosis not present

## 2015-06-22 DIAGNOSIS — Z87891 Personal history of nicotine dependence: Secondary | ICD-10-CM | POA: Diagnosis not present

## 2015-06-22 DIAGNOSIS — E119 Type 2 diabetes mellitus without complications: Secondary | ICD-10-CM | POA: Insufficient documentation

## 2015-06-22 DIAGNOSIS — R1084 Generalized abdominal pain: Secondary | ICD-10-CM | POA: Insufficient documentation

## 2015-06-22 DIAGNOSIS — R112 Nausea with vomiting, unspecified: Secondary | ICD-10-CM | POA: Diagnosis present

## 2015-06-22 DIAGNOSIS — R000 Tachycardia, unspecified: Secondary | ICD-10-CM | POA: Diagnosis not present

## 2015-06-22 LAB — BASIC METABOLIC PANEL
ANION GAP: 11 (ref 5–15)
BUN: 8 mg/dL (ref 6–20)
CHLORIDE: 99 mmol/L — AB (ref 101–111)
CO2: 27 mmol/L (ref 22–32)
Calcium: 9.3 mg/dL (ref 8.9–10.3)
Creatinine, Ser: 0.74 mg/dL (ref 0.61–1.24)
GFR calc Af Amer: >60 mL/min (ref >60)
Glucose, Bld: 212 mg/dL — ABNORMAL HIGH (ref 65–99)
POTASSIUM: 4 mmol/L (ref 3.5–5.1)
SODIUM: 137 mmol/L (ref 135–145)

## 2015-06-22 LAB — CBC WITH DIFFERENTIAL/PLATELET
Basophils Absolute: 0 10*3/uL (ref 0.0–0.1)
Basophils Relative: 0 %
EOS PCT: 2 %
Eosinophils Absolute: 0.3 10*3/uL (ref 0.0–0.7)
HEMATOCRIT: 36.7 % — AB (ref 39.0–52.0)
Hemoglobin: 12.3 g/dL — ABNORMAL LOW (ref 13.0–17.0)
LYMPHS PCT: 22 %
Lymphs Abs: 2.4 10*3/uL (ref 0.7–4.0)
MCH: 27 pg (ref 26.0–34.0)
MCHC: 33.5 g/dL (ref 30.0–36.0)
MCV: 80.7 fL (ref 78.0–100.0)
Monocytes Absolute: 0.6 10*3/uL (ref 0.1–1.0)
Monocytes Relative: 6 %
NEUTROS ABS: 7.8 10*3/uL — AB (ref 1.7–7.7)
Neutrophils Relative %: 70 %
Platelets: 249 10*3/uL (ref 150–400)
RBC: 4.55 MIL/uL (ref 4.22–5.81)
RDW: 13.7 % (ref 11.5–15.5)
WBC: 11.1 10*3/uL — AB (ref 4.0–10.5)

## 2015-06-22 MED ORDER — DIPHENHYDRAMINE HCL 50 MG/ML IJ SOLN
25.0000 mg | Freq: Once | INTRAMUSCULAR | Status: AC
  Administered 2015-06-22: 25 mg via INTRAVENOUS
  Filled 2015-06-22: qty 1

## 2015-06-22 MED ORDER — METOCLOPRAMIDE HCL 5 MG/ML IJ SOLN
10.0000 mg | Freq: Once | INTRAMUSCULAR | Status: AC
  Administered 2015-06-22: 10 mg via INTRAVENOUS
  Filled 2015-06-22: qty 2

## 2015-06-22 MED ORDER — SODIUM CHLORIDE 0.9 % IV SOLN
1000.0000 mL | Freq: Once | INTRAVENOUS | Status: AC
  Administered 2015-06-22: 1000 mL via INTRAVENOUS

## 2015-06-22 MED ORDER — ONDANSETRON HCL 4 MG/2ML IJ SOLN
4.0000 mg | Freq: Once | INTRAMUSCULAR | Status: AC
  Administered 2015-06-22: 4 mg via INTRAVENOUS
  Filled 2015-06-22: qty 2

## 2015-06-22 MED ORDER — ONDANSETRON 4 MG PO TBDP: 4.0000 mg | ORAL_TABLET | Freq: Three times a day (TID) | ORAL | Status: DC | PRN

## 2015-06-22 MED ORDER — SODIUM CHLORIDE 0.9 % IV SOLN
1000.0000 mL | INTRAVENOUS | Status: DC
  Administered 2015-06-22: 1000 mL via INTRAVENOUS

## 2015-06-22 MED ORDER — LOPERAMIDE HCL 2 MG PO CAPS
4.0000 mg | ORAL_CAPSULE | Freq: Once | ORAL | Status: AC
  Administered 2015-06-22: 4 mg via ORAL
  Filled 2015-06-22: qty 2

## 2015-06-22 NOTE — ED Provider Notes (Signed)
CSN: [id number]     Arrival date & time 06/22/15  0050 History   First MD Initiated Contact with Patient 06/22/15 0112    Chief Complaint  Patient presents with  . Emesis     (Consider location/radiation/quality/duration/timing/severity/associated sxs/prior Treatment) HPI patient reports about 5 PM he states his stomach started "feeling funny". He states he had nausea and had sorted diffuse abdominal discomfort. He states he didn't eat dinner because of the nausea however he had eaten lunch which was [place] that he's eaten many times before. He states about 1-2 hours prior to arrival he started having vomiting and diarrhea and has had about 4-5 episodes of each. The diarrhea is described as loose. He denies any fever. He denies being around anybody else who is ill. The last time he vomited was about an hour ago.  Patient states he was diagnosed with diabetes about 8 months ago. He takes metformin 2 pills every day once a day.  PCP PA Leavy Cella at [facility]  Past Medical History  Diagnosis Date  . Bright red rectal bleeding   . GERD (gastroesophageal reflux disease)   . Peptic ulcer   . Diabetes mellitus without complication [facility])    Past Surgical History  Procedure Laterality Date  . None    . Colonoscopy     Family History  Problem Relation Age of Onset  . Colon cancer Neg Hx   . Liver disease Neg Hx   . Cancer Mother     deceased age 80, ?lung   Social History  Substance Use Topics  . Smoking status: Former Smoker    Types: Cigars    Quit date: 09/29/2006  . Smokeless tobacco: None     Comment: smoked cigars 2 daily for 8 years  . Alcohol Use: Yes     Comment: socially, one beer on weekend  employed 1 beer a month  Review of Systems  All other systems reviewed and are negative.     Allergies  Aspirin  Home Medications   Prior to Admission medications   Medication Sig Start Date End Date Taking? Authorizing Provider  ibuprofen (ADVIL,MOTRIN) 600 MG tablet  Take 1 tablet (600 mg total) by mouth every 6 (six) hours as needed. 10/25/14  Yes Derwood Kaplan, MD  metFORMIN (GLUCOPHAGE) 500 MG tablet Take by mouth 2 (two) times daily with a meal.   Yes Historical Provider, MD  pantoprazole (PROTONIX) 40 MG tablet Take 1 tablet (40 mg total) by mouth daily. 09/20/14  Yes Dione Booze, MD  ondansetron (ZOFRAN ODT) 4 MG disintegrating tablet Take 1 tablet (4 mg total) by mouth every 8 (eight) hours as needed for nausea or vomiting. 06/22/15   Devoria Albe, MD  oxyCODONE-acetaminophen (PERCOCET) 5-325 MG per tablet Take 2 tablets by mouth every 4 (four) hours as needed. 06/26/13   Donnetta Hutching, MD  promethazine (PHENERGAN) 25 MG tablet Take 1 tablet (25 mg total) by mouth every 6 (six) hours as needed for nausea. 05/27/11 06/03/11  Bethann Berkshire, MD  promethazine (PHENERGAN) 25 MG tablet Take 1 tablet (25 mg total) by mouth every 6 (six) hours as needed for nausea or vomiting. 06/26/13   Donnetta Hutching, MD   BP 144/96 mmHg  Pulse 114  Temp(Src) 97.6 F (36.4 C) (Oral)  Resp 18  Ht  (1.778 m)  Wt 270 lb (122.471 kg)  BMI 38.74 kg/m2  SpO2 97%  Vital signs normal except for tachycardia  Physical Exam  Constitutional: He is oriented to person,  place, and time. He appears well-developed and well-nourished.  Non-toxic appearance. He does not appear ill. No distress.  HENT:  Head: Normocephalic and atraumatic.  Right Ear: External ear normal.  Left Ear: External ear normal.  Nose: Nose normal. No mucosal edema or rhinorrhea.  Mouth/Throat: Oropharynx is clear and moist and mucous membranes are normal. No dental abscesses or uvula swelling.  Eyes: Conjunctivae and EOM are normal. Pupils are equal, round, and reactive to light.  Neck: Normal range of motion and full passive range of motion without pain. Neck supple.  Cardiovascular: Regular rhythm and normal heart sounds.  Tachycardia present.  Exam reveals no gallop and no friction rub.   No murmur  heard. Pulmonary/Chest: Effort normal and breath sounds normal. No respiratory distress. He has no wheezes. He has no rhonchi. He has no rales. He exhibits no tenderness and no crepitus.  Abdominal: Soft. Normal appearance and bowel sounds are normal. He exhibits no distension. There is no tenderness. There is no rebound and no guarding.    Very active bowel sounds, tender in the LUQ  Musculoskeletal: Normal range of motion. He exhibits no edema or tenderness.  Moves all extremities well.   Neurological: He is alert and oriented to person, place, and time. He has normal strength. No cranial nerve deficit.  Skin: Skin is warm, dry and intact. No rash noted. No erythema. No pallor.  Psychiatric: He has a normal mood and affect. His speech is normal and behavior is normal. His mood appears not anxious.  Nursing note and vitals reviewed.   ED Course  Procedures (including critical care time)  Medications  0.9 %  sodium chloride infusion (0 mLs Intravenous Stopped 06/22/15 0253)    Followed by  0.9 %  sodium chloride infusion (0 mLs Intravenous Stopped 06/22/15 0301)    Followed by  0.9 %  sodium chloride infusion (1,000 mLs Intravenous New Bag/Given 06/22/15 0132)  ondansetron (ZOFRAN) injection 4 mg (4 mg Intravenous Given 06/22/15 0133)  metoCLOPramide (REGLAN) injection 10 mg (10 mg Intravenous Given 06/22/15 0134)  diphenhydrAMINE (BENADRYL) injection 25 mg (25 mg Intravenous Given 06/22/15 0136)  loperamide (IMODIUM) capsule 4 mg (4 mg Oral Given 06/22/15 0137)   Patient was given IV fluids, IV nausea medicine and Imodium.   Recheck 350 patient states she's feeling better. He was willing to do an oral challenge.  Patient was able to drink without having vomiting, diarrhea or feeling worse. He was discharged home.   Labs Review Results for orders placed or performed during the hospital encounter of 06/22/15  Basic metabolic panel  Result Value Ref Range   Sodium 137 135 - 145  mmol/L   Potassium 4.0 3.5 - 5.1 mmol/L   Chloride 99 (L) 101 - 111 mmol/L   CO2 27 22 - 32 mmol/L   Glucose, Bld 212 (H) 65 - 99 mg/dL   BUN 8 6 - 20 mg/dL   Creatinine, Ser 6.210.74 0.61 - 1.24 mg/dL   Calcium 9.3 8.9 - 30.810.3 mg/dL   GFR calc non Af Amer >60 >60 mL/min   GFR calc Af Amer >60 >60 mL/min   Anion gap 11 5 - 15  CBC with Differential  Result Value Ref Range   WBC 11.1 (H) 4.0 - 10.5 K/uL   RBC 4.55 4.22 - 5.81 MIL/uL   Hemoglobin 12.3 (L) 13.0 - 17.0 g/dL   HCT 65.736.7 (L) 84.639.0 - 96.252.0 %   MCV 80.7 78.0 - 100.0 fL   MCH 27.0  26.0 - 34.0 pg   MCHC 33.5 30.0 - 36.0 g/dL   RDW 30.8 65.7 - 84.6 %   Platelets 249 150 - 400 K/uL   Neutrophils Relative % 70 %   Neutro Abs 7.8 (H) 1.7 - 7.7 K/uL   Lymphocytes Relative 22 %   Lymphs Abs 2.4 0.7 - 4.0 K/uL   Monocytes Relative 6 %   Monocytes Absolute 0.6 0.1 - 1.0 K/uL   Eosinophils Relative 2 %   Eosinophils Absolute 0.3 0.0 - 0.7 K/uL   Basophils Relative 0 %   Basophils Absolute 0.0 0.0 - 0.1 K/uL   Laboratory interpretation all normal except leukocytosis, mild anemia, hyperglycemia without anion gap     Imaging Review No results found. I have personally reviewed and evaluated these images and lab results as part of my medical decision-making.   EKG Interpretation None      MDM   patient presents with nausea, vomiting, diarrhea that was easily improved with IV fluids and nausea and diarrhea meds. He was discharged home.    Final diagnoses:  Nausea vomiting and diarrhea    New Prescriptions   ONDANSETRON (ZOFRAN ODT) 4 MG DISINTEGRATING TABLET    Take 1 tablet (4 mg total) by mouth every 8 (eight) hours as needed for nausea or vomiting.    Plan discharge  Devoria Albe, MD, Concha Pyo, MD 06/22/15 [phone]

## 2015-06-22 NOTE — Discharge Instructions (Signed)
Drink plenty of fluids (clear liquids) the next 12 hrs then start a bland diet (toast, crackers, jello). Avoid fried, spicy or greasy foods for the next several days Diarrhea Diarrhea is watery poop (stool). It can make you feel weak, tired, thirsty, or give you a dry mouth (signs of dehydration). Watery poop is a sign of another problem, most often an infection. It often lasts 2-3 days. It can last longer if it is a sign of something serious. Take care of yourself as told by your doctor. HOME CARE   Drink 1 cup (8 ounces) of fluid each time you have watery poop.  Do not drink the following fluids:  Those that contain simple sugars (fructose, glucose, galactose, lactose, sucrose, maltose).  Sports drinks.  Fruit juices.  Whole milk products.  Sodas.  Drinks with caffeine (coffee, tea, soda) or alcohol.  Oral rehydration solution may be used if the doctor says it is okay. You may make your own solution. Follow this recipe:   - teaspoon table salt.   teaspoon baking soda.   teaspoon salt substitute containing potassium chloride.  1 tablespoons sugar.  1 liter (34 ounces) of water.  Avoid the following foods:  High fiber foods, such as raw fruits and vegetables.  Nuts, seeds, and whole grain breads and cereals.   Those that are sweetened with sugar alcohols (xylitol, sorbitol, mannitol).  Try eating the following foods:  Starchy foods, such as rice, toast, pasta, low-sugar cereal, oatmeal, baked potatoes, crackers, and bagels.  Bananas.  Applesauce.  Eat probiotic-rich foods, such as yogurt and milk products that are fermented.  Wash your hands well after each time you have watery poop.  Only take medicine as told by your doctor.  Take a warm bath to help lessen burning or pain from having watery poop. GET HELP RIGHT AWAY IF:   You cannot drink fluids without throwing up (vomiting).  You keep throwing up.  You have blood in your poop, or your poop looks  black and tarry.  You do not pee (urinate) in 6-8 hours, or there is only a small amount of very dark pee.  You have belly (abdominal) pain that gets worse or stays in the same spot (localizes).  You are weak, dizzy, confused, or light-headed.  You have a very bad headache.  Your watery poop gets worse or does not get better.  You have a fever or lasting symptoms for more than 2-3 days.  You have a fever and your symptoms suddenly get worse. MAKE SURE YOU:   Understand these instructions.  Will watch your condition.  Will get help right away if you are not doing well or get worse.   This information is not intended to replace advice given to you by your health care provider. Make sure you discuss any questions you have with your health care provider.   Document Released: 12/16/2007 Document Revised: 07/20/2014 Document Reviewed: 03/06/2012 Elsevier Interactive Patient Education 2016 [employer] Inc[employer]  Nausea and Vomiting Nausea means you feel sick to your stomach. Throwing up (vomiting) is a reflex where stomach contents come out of your mouth. HOME CARE   Take medicine as told by your doctor.  Do not force yourself to eat. However, you do need to drink fluids.  If you feel like eating, eat a normal diet as told by your doctor.  Eat rice, wheat, potatoes, bread, lean meats, yogurt, fruits, and vegetables.  Avoid high-fat foods.  Drink enough fluids to keep your pee (urine)  clear or pale yellow.  Ask your doctor how to replace body fluid losses (rehydrate). Signs of body fluid loss (dehydration) include:  Feeling very thirsty.  Dry lips and mouth.  Feeling dizzy.  Dark pee.  Peeing less than normal.  Feeling confused.  Fast breathing or heart rate. GET HELP RIGHT AWAY IF:   You have blood in your throw up.  You have black or bloody poop (stool).  You have a bad headache or stiff neck.  You feel confused.  You have bad belly (abdominal) pain.  You  have chest pain or trouble breathing.  You do not pee at least once every 8 hours.  You have cold, clammy skin.  You keep throwing up after 24 to 48 hours.  You have a fever. MAKE SURE YOU:   Understand these instructions.  Will watch your condition.  Will get help right away if you are not doing well or get worse.   This information is not intended to replace advice given to you by your health care provider. Make sure you discuss any questions you have with your health care provider.   Document Released: 12/16/2007 Document Revised: 09/21/2011 Document Reviewed: 11/28/2010 Elsevier Interactive Patient Education 2016 [employer].  . Use the zofran for nausea or vomiting. Take imodium OTC for diarrhea. Avoid mild products until the diarrhea is gone. Recheck if you get worse.

## 2015-06-22 NOTE — ED Notes (Signed)
Pt alert & oriented x4, stable gait. Patient given discharge instructions, paperwork & prescription(s). Patient  instructed to stop at the registration desk to finish any additional paperwork. Patient verbalized understanding. Pt left department w/ no further questions. 

## 2015-06-22 NOTE — ED Notes (Signed)
Pt states stomach was aching yesterday around 1700. Started vomiting & diarrhea about 1 hour ago.

## 2015-06-22 NOTE — ED Notes (Signed)
Pt given sprite to drink at this time,

## 2015-06-22 NOTE — ED Notes (Signed)
Pt called to desk to advised he was able to keep drink down. EDP notified.

## 2015-11-18 ENCOUNTER — Encounter (HOSPITAL_COMMUNITY): Payer: Self-pay | Admitting: *Deleted

## 2015-11-18 ENCOUNTER — Emergency Department (HOSPITAL_COMMUNITY)
Admission: EM | Admit: 2015-11-18 | Discharge: 2015-11-18 | Disposition: A | Discharge status: Home / Self Care | Payer: BLUE CROSS/BLUE SHIELD | Attending: Emergency Medicine | Admitting: Emergency Medicine

## 2015-11-18 DIAGNOSIS — Z87891 Personal history of nicotine dependence: Secondary | ICD-10-CM | POA: Diagnosis not present

## 2015-11-18 DIAGNOSIS — E119 Type 2 diabetes mellitus without complications: Secondary | ICD-10-CM | POA: Insufficient documentation

## 2015-11-18 DIAGNOSIS — K0889 Other specified disorders of teeth and supporting structures: Secondary | ICD-10-CM

## 2015-11-18 MED ORDER — PENICILLIN V POTASSIUM 500 MG PO TABS: 500.0000 mg | ORAL_TABLET | Freq: Four times a day (QID) | ORAL | Status: DC

## 2015-11-18 MED ORDER — TRAMADOL HCL 50 MG PO TABS: 100.0000 mg | ORAL_TABLET | Freq: Four times a day (QID) | ORAL | Status: DC | PRN

## 2015-11-18 MED ORDER — ACETAMINOPHEN 500 MG PO TABS
1000.0000 mg | ORAL_TABLET | Freq: Once | ORAL | Status: AC
  Administered 2015-11-18: 1000 mg via ORAL
  Filled 2015-11-18: qty 2

## 2015-11-18 MED ORDER — TRAMADOL HCL 50 MG PO TABS
100.0000 mg | ORAL_TABLET | Freq: Once | ORAL | Status: AC
  Administered 2015-11-18: 100 mg via ORAL
  Filled 2015-11-18: qty 2

## 2015-11-18 MED ORDER — PENICILLIN V POTASSIUM 250 MG PO TABS
500.0000 mg | ORAL_TABLET | Freq: Once | ORAL | Status: AC
  Administered 2015-11-18: 500 mg via ORAL
  Filled 2015-11-18: qty 2

## 2015-11-18 NOTE — ED Provider Notes (Signed)
CSN: [id number]     Arrival date & time 11/18/15  0123 History   First MD Initiated Contact with Patient 11/18/15 0340     Chief Complaint  Patient presents with  . Dental Pain     (Consider location/radiation/quality/duration/timing/severity/associated sxs/prior Treatment) HPI Pt reports about 6 months ago he had a tooth pulled from his left lower jaw at [facility]. He had a large cavity in the next tooth that they decided to fill with the understanding he may need a root canal or have To have the tooth pulled.  He reports he was supposed to have the tooth pulled about 3 weeks ago but due to a conflict with his work schedule he couldn't keep that appointment. He states for the past 2 weeks off and on his been having some pain in that tooth. However he states today meaning May 7 the pain got a lot worse. He took a Goody powder at noon, ibuprofen at 6 PM and at 7 PM without relief. He denies any fever. He states the pain is made worse by talking when his teeth hit together, but  hot and cold foods do not make it hurt. He also states today when he pressed on his jawline it was painful underneath that tooth.   PCP none Dentist [facility]  Past Medical History  Diagnosis Date  . Bright red rectal bleeding   . GERD (gastroesophageal reflux disease)   . Peptic ulcer   . Diabetes mellitus without complication [facility])    Past Surgical History  Procedure Laterality Date  . None    . Colonoscopy     Family History  Problem Relation Age of Onset  . Colon cancer Neg Hx   . Liver disease Neg Hx   . Cancer Mother     deceased age 42, ?lung   Social History  Substance Use Topics  . Smoking status: Former Smoker    Types: Cigars    Quit date: 09/29/2006  . Smokeless tobacco: None     Comment: smoked cigars 2 daily for 8 years  . Alcohol Use: Yes     Comment: socially, one beer on weekend  employed  Review of Systems  All other systems reviewed and are  negative.     Allergies  Aspirin  Home Medications   Prior to Admission medications   Medication Sig Start Date End Date Taking? Authorizing Provider  ibuprofen (ADVIL,MOTRIN) 600 MG tablet Take 1 tablet (600 mg total) by mouth every 6 (six) hours as needed. 10/25/14   Derwood Kaplan, MD  metFORMIN (GLUCOPHAGE) 500 MG tablet Take by mouth 2 (two) times daily with a meal.    Historical Provider, MD  ondansetron (ZOFRAN ODT) 4 MG disintegrating tablet Take 1 tablet (4 mg total) by mouth every 8 (eight) hours as needed for nausea or vomiting. 06/22/15   Devoria Albe, MD  oxyCODONE-acetaminophen (PERCOCET) 5-325 MG per tablet Take 2 tablets by mouth every 4 (four) hours as needed. 06/26/13   Donnetta Hutching, MD  pantoprazole (PROTONIX) 40 MG tablet Take 1 tablet (40 mg total) by mouth daily. 09/20/14   Dione Booze, MD  penicillin v potassium (VEETID) 500 MG tablet Take 1 tablet (500 mg total) by mouth 4 (four) times daily. 11/18/15   Devoria Albe, MD  promethazine (PHENERGAN) 25 MG tablet Take 1 tablet (25 mg total) by mouth every 6 (six) hours as needed for nausea. 05/27/11 06/03/11  Bethann Berkshire, MD  promethazine (PHENERGAN) 25 MG tablet  Take 1 tablet (25 mg total) by mouth every 6 (six) hours as needed for nausea or vomiting. 06/26/13   Donnetta HutchingBrian Cook, MD  traMADol (ULTRAM) 50 MG tablet Take 2 tablets (100 mg total) by mouth every 6 (six) hours as needed. 11/18/15   Devoria AlbeIva Arieana Somoza, MD   BP 141/86 mmHg  Pulse 79  Temp(Src) 98.1 F (36.7 C) (Oral)  Resp 18  Ht 5\' 10"  (1.778 m)  Wt 275 lb (124.739 kg)  BMI 39.46 kg/m2  SpO2 97%  Vital signs normal   Physical Exam  Constitutional: He is oriented to person, place, and time. He appears well-developed and well-nourished.  Non-toxic appearance. He does not appear ill. No distress.  HENT:  Head: Normocephalic and atraumatic.  Right Ear: External ear normal.  Left Ear: External ear normal.  Nose: Nose normal. No mucosal edema or rhinorrhea.  Mouth/Throat:  Oropharynx is clear and moist and mucous membranes are normal. No dental abscesses or uvula swelling.    Patient has no obvious cavity to the tooth that is painful. He has inconsistent pain to percussion of that tooth. The gum does not appear to be swollen. No facial swelling is seen.  Eyes: Conjunctivae and EOM are normal.  Neck: Normal range of motion and full passive range of motion without pain.  Pulmonary/Chest: Effort normal. No respiratory distress. He has no rhonchi. He has no rales. He exhibits no crepitus.  Abdominal: Normal appearance.  Musculoskeletal: Normal range of motion.  Moves all extremities well.   Neurological: He is alert and oriented to person, place, and time. He has normal strength. No cranial nerve deficit.  Skin: Skin is warm, dry and intact. No rash noted. No erythema. No pallor.  Psychiatric: He has a normal mood and affect. His speech is normal and behavior is normal. His mood appears not anxious.  Nursing note and vitals reviewed.   ED Course  Procedures (including critical care time)  Medications  traMADol (ULTRAM) tablet 100 mg (not administered)  penicillin v potassium (VEETID) tablet 500 mg (not administered)  acetaminophen (TYLENOL) tablet 1,000 mg (not administered)    Patient was given pain medication and started on penicillin. He is going to follow-up with his dentist.  MDM   Final diagnoses:  Toothache    New Prescriptions   PENICILLIN V POTASSIUM (VEETID) 500 MG TABLET    Take 1 tablet (500 mg total) by mouth 4 (four) times daily.   TRAMADOL (ULTRAM) 50 MG TABLET    Take 2 tablets (100 mg total) by mouth every 6 (six) hours as needed.    Plan discharge  Devoria AlbeIva Kiaan Overholser, MD, Concha PyoFACEP     Meleah Demeyer, MD 11/18/15 [phone]

## 2015-11-18 NOTE — Discharge Instructions (Signed)
Take the penicillin until gone. Use the tramadol with acetaminophen 1000 mg + ibuprofen 600 mg 4 times a day for pain. [facility]. Return to the ED if you get a high fever, or have difficulty breathing or swallowing.

## 2015-11-18 NOTE — ED Notes (Signed)
Pt c/o pain to left lower tooth; pt states he had it filled but he is now having pain to that tooth

## 2016-06-14 ENCOUNTER — Encounter (HOSPITAL_COMMUNITY): Payer: Self-pay | Admitting: Emergency Medicine

## 2016-06-14 ENCOUNTER — Emergency Department (HOSPITAL_COMMUNITY): Payer: BLUE CROSS/BLUE SHIELD

## 2016-06-14 ENCOUNTER — Emergency Department (HOSPITAL_COMMUNITY)
Admission: EM | Admit: 2016-06-14 | Discharge: 2016-06-14 | Disposition: A | Discharge status: Home / Self Care | Payer: BLUE CROSS/BLUE SHIELD | Attending: Emergency Medicine | Admitting: Emergency Medicine

## 2016-06-14 DIAGNOSIS — Z7984 Long term (current) use of oral hypoglycemic drugs: Secondary | ICD-10-CM | POA: Diagnosis not present

## 2016-06-14 DIAGNOSIS — Z79899 Other long term (current) drug therapy: Secondary | ICD-10-CM | POA: Diagnosis not present

## 2016-06-14 DIAGNOSIS — W208XXA Other cause of strike by thrown, projected or falling object, initial encounter: Secondary | ICD-10-CM | POA: Insufficient documentation

## 2016-06-14 DIAGNOSIS — Y929 Unspecified place or not applicable: Secondary | ICD-10-CM | POA: Insufficient documentation

## 2016-06-14 DIAGNOSIS — Z791 Long term (current) use of non-steroidal anti-inflammatories (NSAID): Secondary | ICD-10-CM | POA: Diagnosis not present

## 2016-06-14 DIAGNOSIS — Z87891 Personal history of nicotine dependence: Secondary | ICD-10-CM | POA: Diagnosis not present

## 2016-06-14 DIAGNOSIS — S9031XA Contusion of right foot, initial encounter: Secondary | ICD-10-CM | POA: Insufficient documentation

## 2016-06-14 DIAGNOSIS — S90129A Contusion of unspecified lesser toe(s) without damage to nail, initial encounter: Secondary | ICD-10-CM

## 2016-06-14 DIAGNOSIS — S99921A Unspecified injury of right foot, initial encounter: Secondary | ICD-10-CM | POA: Diagnosis present

## 2016-06-14 DIAGNOSIS — Y9389 Activity, other specified: Secondary | ICD-10-CM | POA: Insufficient documentation

## 2016-06-14 DIAGNOSIS — S9030XA Contusion of unspecified foot, initial encounter: Secondary | ICD-10-CM

## 2016-06-14 DIAGNOSIS — Y999 Unspecified external cause status: Secondary | ICD-10-CM | POA: Diagnosis not present

## 2016-06-14 DIAGNOSIS — E119 Type 2 diabetes mellitus without complications: Secondary | ICD-10-CM | POA: Diagnosis not present

## 2016-06-14 MED ORDER — TRAMADOL HCL 50 MG PO TABS: ORAL_TABLET | ORAL | 0 refills | Status: DC

## 2016-06-14 NOTE — ED Triage Notes (Signed)
Pt c/o right foot pain and swelling. States he dropped a cinder block on it yesterday.

## 2016-06-14 NOTE — Discharge Instructions (Signed)
Follow-up with her family doctor if not improving. Keep her foot elevated

## 2016-06-14 NOTE — ED Provider Notes (Signed)
[facility]-EMER[facility]ENCY DEPT Provider Note   CSN: [id number] Arrival date & time: 06/14/16  [id number]   By signing my name below, I, Casey Williams, attest that this documentation has been prepared under the direction and in the presence of Casey BerkshireJoseph Lakeeta Dobosz, MD  Electronically Signed: [name], ED Scribe. 06/14/16. 8:44 AM.   History   Chief Complaint Chief Complaint  Patient presents with  . Foot Pain    The history is provided by the patient. No language interpreter was used.  Foot Pain  This is a new problem. The current episode started yesterday. The problem occurs constantly. The problem has not changed since onset.Pertinent negatives include no chest pain, no abdominal pain and no headaches. Exacerbated by: palpation. Nothing relieves the symptoms. He has tried nothing for the symptoms.   HPI Comments:  Casey Williams is a 35 y.o. male who presents to the Emergency Department complaining of sudden onset, moderate right foot pain with associated swelling x yesterday. Pt states he dropped an 8-inch block on his right foot. No alleviating factors noted. Pt denies any other associated symptoms and modifying factors at this time. Tetanus is not up to date.   Past Medical History:  Diagnosis Date  . Bright red rectal bleeding   . Diabetes mellitus without complication (HCC)   . GERD (gastroesophageal reflux disease)   . Peptic ulcer     Patient Active Problem List   Diagnosis Date Noted  . Rectal bleed 09/29/2011  . Normocytic anemia 09/29/2011  . Rectal pain 09/29/2011  . GERD (gastroesophageal reflux disease) 09/29/2011  . Constipation 09/29/2011    Past Surgical History:  Procedure Laterality Date  . COLONOSCOPY    . None      Home Medications    Prior to Admission medications   Medication Sig Start Date End Date Taking? Authorizing Provider  ibuprofen (ADVIL,MOTRIN) 600 MG tablet Take 1 tablet (600 mg total) by mouth every 6 (six) hours as needed. 10/25/14   Casey KaplanAnkit Nanavati,  MD  metFORMIN (GLUCOPHAGE) 500 MG tablet Take by mouth 2 (two) times daily with a meal.    Historical Provider, MD  ondansetron (ZOFRAN ODT) 4 MG disintegrating tablet Take 1 tablet (4 mg total) by mouth every 8 (eight) hours as needed for nausea or vomiting. 06/22/15   Casey AlbeIva Knapp, MD  oxyCODONE-acetaminophen (PERCOCET) 5-325 MG per tablet Take 2 tablets by mouth every 4 (four) hours as needed. 06/26/13   Donnetta HutchingBrian Cook, MD  pantoprazole (PROTONIX) 40 MG tablet Take 1 tablet (40 mg total) by mouth daily. 09/20/14   Dione Boozeavid Glick, MD  penicillin v potassium (VEETID) 500 MG tablet Take 1 tablet (500 mg total) by mouth 4 (four) times daily. 11/18/15   Casey AlbeIva Knapp, MD  promethazine (PHENERGAN) 25 MG tablet Take 1 tablet (25 mg total) by mouth every 6 (six) hours as needed for nausea. 05/27/11 06/03/11  Casey BerkshireJoseph Jorgina Binning, MD  promethazine (PHENERGAN) 25 MG tablet Take 1 tablet (25 mg total) by mouth every 6 (six) hours as needed for nausea or vomiting. 06/26/13   Donnetta HutchingBrian Cook, MD  traMADol (ULTRAM) 50 MG tablet Take 2 tablets (100 mg total) by mouth every 6 (six) hours as needed. 11/18/15   Casey AlbeIva Knapp, MD    Family History Family History  Problem Relation Age of Onset  . Cancer Mother     deceased age 36, ?lung  . Colon cancer Neg Hx   . Liver disease Neg Hx     Social History Social History  Substance  Use Topics  . Smoking status: Former Smoker    Types: Cigars    Quit date: 09/29/2006  . Smokeless tobacco: Never Used     Comment: smoked cigars 2 daily for 8 years  . Alcohol use Yes     Comment: socially, one beer on weekend     Allergies   Aspirin   Review of Systems Review of Systems  Constitutional: Negative for appetite change and fatigue.  HENT: Negative for congestion, ear discharge and sinus pressure.   Eyes: Negative for discharge.  Respiratory: Negative for cough.   Cardiovascular: Negative for chest pain.  Gastrointestinal: Negative for abdominal pain and diarrhea.  Genitourinary:  Negative for frequency and hematuria.  Musculoskeletal: Positive for arthralgias, joint swelling and myalgias. Negative for back pain.  Skin: Negative for rash.  Neurological: Negative for seizures and headaches.  Psychiatric/Behavioral: Negative for hallucinations.    Physical Exam Updated Vital Signs BP 156/76   Pulse 85   Temp 98.2 F (36.8 C)   Resp 18   Ht 5\' 10"  (1.778 m)   Wt 275 lb (124.7 kg)   SpO2 98%   BMI 39.46 kg/m   Physical Exam  Constitutional: He is oriented to person, place, and time. He appears well-developed.  HENT:  Head: Normocephalic.  Eyes: Conjunctivae are normal.  Neck: No tracheal deviation present.  Cardiovascular:  No murmur heard. Musculoskeletal: Normal range of motion. He exhibits edema and tenderness.  Left foot: Moderately swollen   Neurological: He is oriented to person, place, and time.  Skin: Skin is warm.  small abrasion on the top of right foot   Psychiatric: He has a normal mood and affect.  Nursing note and vitals reviewed.    ED Treatments / Results  DIAGNOSTIC STUDIES:  Oxygen Saturation is 98% on RA, normal by my interpretation.    COORDINATION OF CARE:  8:40 AM Discussed treatment plan with pt at bedside and pt agreed to plan.  Labs (all labs ordered are listed, but only abnormal results are displayed) Labs Reviewed - No data to display  EKG  EKG Interpretation None       Radiology No results found.  Procedures Procedures (including critical care time)  Medications Ordered in ED Medications - No data to display   Initial Impression / Assessment and Plan / ED Course  I have reviewed the triage vital signs and the nursing notes.  Pertinent labs & imaging results that were available during my care of the patient were reviewed by me and considered in my medical decision making (see chart for details).  Clinical Course     Patient with contusion to right foot. He will be treated with Ultram and Tylenol  will follow-up as needed  Final Clinical Impressions(s) / ED Diagnoses   Final diagnoses:  None    New Prescriptions New Prescriptions   No medications on file  The chart was scribed for me under my direct supervision.  I personally performed the history, physical, and medical decision making and all procedures in the evaluation of this patient.Casey Williams.     Viveca Beckstrom, MD 06/14/16 [phone]

## 2016-12-04 ENCOUNTER — Emergency Department (HOSPITAL_COMMUNITY)
Admission: EM | Admit: 2016-12-04 | Discharge: 2016-12-05 | Disposition: A | Discharge status: Home / Self Care | Payer: BLUE CROSS/BLUE SHIELD | Attending: Emergency Medicine | Admitting: Emergency Medicine

## 2016-12-04 ENCOUNTER — Encounter (HOSPITAL_COMMUNITY): Payer: Self-pay | Admitting: Emergency Medicine

## 2016-12-04 DIAGNOSIS — Z87891 Personal history of nicotine dependence: Secondary | ICD-10-CM | POA: Insufficient documentation

## 2016-12-04 DIAGNOSIS — Z79899 Other long term (current) drug therapy: Secondary | ICD-10-CM | POA: Diagnosis not present

## 2016-12-04 DIAGNOSIS — I480 Paroxysmal atrial fibrillation: Secondary | ICD-10-CM | POA: Insufficient documentation

## 2016-12-04 DIAGNOSIS — R1084 Generalized abdominal pain: Secondary | ICD-10-CM | POA: Insufficient documentation

## 2016-12-04 DIAGNOSIS — E119 Type 2 diabetes mellitus without complications: Secondary | ICD-10-CM | POA: Diagnosis not present

## 2016-12-04 DIAGNOSIS — R197 Diarrhea, unspecified: Secondary | ICD-10-CM | POA: Insufficient documentation

## 2016-12-04 DIAGNOSIS — I4891 Unspecified atrial fibrillation: Secondary | ICD-10-CM

## 2016-12-04 DIAGNOSIS — R112 Nausea with vomiting, unspecified: Secondary | ICD-10-CM | POA: Diagnosis not present

## 2016-12-04 MED ORDER — SODIUM CHLORIDE 0.9 % IV BOLUS (SEPSIS)
1000.0000 mL | Freq: Once | INTRAVENOUS | Status: AC
  Administered 2016-12-05: 1000 mL via INTRAVENOUS

## 2016-12-04 MED ORDER — ONDANSETRON HCL 4 MG/2ML IJ SOLN
4.0000 mg | Freq: Once | INTRAMUSCULAR | Status: AC
  Administered 2016-12-05: 4 mg via INTRAVENOUS
  Filled 2016-12-04: qty 2

## 2016-12-04 MED ORDER — DIPHENOXYLATE-ATROPINE 2.5-0.025 MG PO TABS
2.0000 | ORAL_TABLET | Freq: Once | ORAL | Status: AC
  Administered 2016-12-05: 2 via ORAL
  Filled 2016-12-04: qty 2

## 2016-12-04 NOTE — ED Provider Notes (Addendum)
[facility] DE[facility] Provider Note   CSN: [id number] Arrival date & time: 12/04/16  2127  By signing my name below, I, Casey Williams, attest that this documentation has been prepared under the direction and in the presence of Casey Williams, Casey Williams, *. Electronically Signed: Thelma Williams, Scribe. 12/04/16. 11:50 PM.   History   Chief Complaint Chief Complaint  Patient presents with  . Nausea    after eating at [facility]  . Emesis  The history is provided by the patient. No language interpreter was used.    HPI Comments: Casey Williams is a 36 y.o. male who presents to the Emergency Department complaining of constant, gradually worsening nausea, vomiting, and diarrhea that occurred 4 hours ago. He states he ate dinner at [employer] and the symptoms occurred shortly after. He has associated abdominal pain. He denies fevers and chills.    Past Medical History:  Diagnosis Date  . Bright red rectal bleeding   . Diabetes mellitus without complication (HCC)   . GERD (gastroesophageal reflux disease)   . Peptic ulcer     Patient Active Problem List   Diagnosis Date Noted  . Rectal bleed 09/29/2011  . Normocytic anemia 09/29/2011  . Rectal pain 09/29/2011  . GERD (gastroesophageal reflux disease) 09/29/2011  . Constipation 09/29/2011    Past Surgical History:  Procedure Laterality Date  . COLONOSCOPY    . None         Home Medications    Prior to Admission medications   Medication Sig Start Date End Date Taking? Authorizing Provider  ibuprofen (ADVIL,MOTRIN) 600 MG tablet Take 1 tablet (600 mg total) by mouth every 6 (six) hours as needed. 10/25/14   Derwood Kaplan, MD  metFORMIN (GLUCOPHAGE) 500 MG tablet Take by mouth 2 (two) times daily with a meal.    [provider]  ondansetron (ZOFRAN ODT) 4 MG disintegrating tablet Take 1 tablet (4 mg total) by mouth every 8 (eight) hours as needed for nausea or vomiting. 06/22/15   Devoria Albe, MD  oxyCODONE-acetaminophen  (PERCOCET) 5-325 MG per tablet Take 2 tablets by mouth every 4 (four) hours as needed. 06/26/13   Donnetta Hutching, MD  pantoprazole (PROTONIX) 40 MG tablet Take 1 tablet (40 mg total) by mouth daily. 09/20/14   Dione Booze, MD  penicillin v potassium (VEETID) 500 MG tablet Take 1 tablet (500 mg total) by mouth 4 (four) times daily. 11/18/15   Devoria Albe, MD  promethazine (PHENERGAN) 25 MG tablet Take 1 tablet (25 mg total) by mouth every 6 (six) hours as needed for nausea. 05/27/11 06/03/11  Bethann Berkshire, MD  promethazine (PHENERGAN) 25 MG tablet Take 1 tablet (25 mg total) by mouth every 6 (six) hours as needed for nausea or vomiting. 06/26/13   Donnetta Hutching, MD  traMADol Janean Sark) 50 MG tablet Take one every 6 hours for pain not helped by tylenol 06/14/16   Bethann Berkshire, MD    Family History Family History  Problem Relation Age of Onset  . Cancer Mother        deceased age 82, ?lung  . Colon cancer Neg Hx   . Liver disease Neg Hx     Social History Social History  Substance Use Topics  . Smoking status: Former Smoker    Types: Cigars    Quit date: 09/29/2006  . Smokeless tobacco: Never Used     Comment: smoked cigars 2 daily for 8 years  . Alcohol use Yes     Comment: socially,  one beer on weekend     Allergies   Aspirin   Review of Systems Review of Systems  Constitutional: Negative for chills and fever.  Gastrointestinal: Positive for abdominal pain, diarrhea, nausea and vomiting.     Physical Exam Updated Vital Signs BP (!) 156/78 (BP Location: Right Arm)   Pulse (!) 102   Temp 98.3 F (36.8 C) (Oral)   Resp 16   Ht 6' (1.829 m)   Wt 124.7 kg (275 lb)   SpO2 97%   BMI 37.30 kg/m   Physical Exam  Constitutional: He is oriented to person, place, and time. He appears well-developed and well-nourished. No distress.  HENT:  Head: Normocephalic and atraumatic.  Right Ear: Hearing normal.  Left Ear: Hearing normal.  Nose: Nose normal.  Mouth/Throat: Oropharynx is  clear and moist and mucous membranes are normal.  Eyes: Conjunctivae and EOM are normal. Pupils are equal, round, and reactive to light.  Neck: Normal range of motion. Neck supple.  Cardiovascular: S1 normal and S2 normal.  Exam reveals no gallop and no friction rub.   No murmur heard. irregular  Pulmonary/Chest: Effort normal and breath sounds normal. No respiratory distress. He exhibits no tenderness.  Abdominal: Soft. Normal appearance and bowel sounds are normal. He exhibits no distension. There is no hepatosplenomegaly. There is no tenderness. There is no rebound, no guarding, no tenderness at McBurney's point and negative Murphy's sign. No hernia.  Generalized tenderness  Musculoskeletal: Normal range of motion.  Neurological: He is alert and oriented to person, place, and time. He has normal strength. No cranial nerve deficit or sensory deficit. Coordination normal. GCS eye subscore is 4. GCS verbal subscore is 5. GCS motor subscore is 6.  Skin: Skin is warm, dry and intact. No rash noted. No cyanosis.  Psychiatric: He has a normal mood and affect. His speech is normal and behavior is normal. Thought content normal.  Nursing note and vitals reviewed.    ED Treatments / Results  DIAGNOSTIC STUDIES: Oxygen Saturation is 97% on RA, normal by my interpretation.    COORDINATION OF CARE: 11:49 PM Discussed treatment plan with pt at bedside and pt agreed to plan.  Labs (all labs ordered are listed, but only abnormal results are displayed) Labs Reviewed  CBC WITH DIFFERENTIAL/PLATELET - Abnormal; Notable for the following:       Result Value   WBC 15.0 (*)    Hemoglobin 12.4 (*)    HCT 37.9 (*)    Neutro Abs 12.2 (*)    All other components within normal limits  COMPREHENSIVE METABOLIC PANEL - Abnormal; Notable for the following:    Glucose, Bld 219 (*)    All other components within normal limits  LIPASE, BLOOD    EKG  EKG Interpretation  Date/Time:  Saturday Dec 05 2016  00:05:38 EDT Ventricular Rate:  101 PR Interval:    QRS Duration: 94 QT Interval:  354 QTC Calculation: 459 R Axis:   8 Text Interpretation:  Atrial fibrillation Ventricular premature complex Low voltage, precordial leads Baseline wander in lead(s) V2 Confirmed by Gilda Crease [phone]) on 12/05/2016 12:14:47 AM       Radiology No results found.  Procedures Procedures (including critical care time)  Medications Ordered in ED Medications  metoprolol tartrate (LOPRESSOR) injection 5 mg (not administered)  metoprolol tartrate (LOPRESSOR) tablet 50 mg (not administered)  sodium chloride 0.9 % bolus 1,000 mL (1,000 mLs Intravenous New Bag/Given 12/05/16 0011)  ondansetron (ZOFRAN) injection 4 mg (4  mg Intravenous Given 12/05/16 0011)  diphenoxylate-atropine (LOMOTIL) 2.5-0.025 MG per tablet 2 tablet (2 tablets Oral Given 12/05/16 0011)     Initial Impression / Assessment and Plan / ED Course  I have reviewed the triage vital signs and the nursing notes.  Pertinent labs & imaging results that were available during my care of the patient were reviewed by me and considered in my medical decision making (see chart for details).     Patient presents to the ER for evaluation of acute onset of abdominal pain with nausea, vomiting and diarrhea after [facility] at [facility]. Patient experiencing discomfort across the upper abdomen, no lower abdominal pain or tenderness. Abdominal exam was benign, no evidence of acute surgical process. Patient given IV fluids, Zofran, Lomotil with resolution of his symptoms.  Patient found to be in atrial fibrillation incidentally. He is asymptomatic, not expressing shortness of breath, palpitations or chest discomfort. It is not clear if this started with the current illness or has been ongoing for some time. He is therefore not safe for cardioversion. Patient given Lopressor for rate control. This was discussed with Dr. Ronelle NighMacLean, on-call for cardiology. He  agreed with rate control, initiation of oral anticoagulation and outpatient follow-up with cardiology. Patient is in agreement with this plan.  CHADS? Score for Atrial Fibrillation Stroke Risk from [email or web address].[email or web address]  on 12/05/2016 ** All calculations should be rechecked by clinician prior to use **  RESULT SUMMARY: 1 points Intermediate risk of thromboembolic event. 2.8% risk of event per year if no coumadin.  The adjusted stroke rate was the expected stroke rate per 100 person-years derived from the multivariable model assuming that aspirin was not taken.   INPUTS: <abbr title='Congestive heart failure'>CHF</abbr> history -> 0 = No Hypertension history -> 0 = No Age ? 75 years -> 0 = No Diabetes mellitus history -> 1 = Yes Stroke or TIA symptoms previously -> 0 = No   Final Clinical Impressions(s) / ED Diagnoses   Final diagnoses:  Nausea vomiting and diarrhea  Atrial fibrillation with RVR (HCC)    New Prescriptions New Prescriptions   No medications on file  I personally performed the services described in this documentation, which was scribed in my presence. The recorded information has been reviewed and is accurate.     Gilda CreasePollina, Raymone Pembroke J, MD 12/05/16 0147    Gilda CreasePollina, Milad Bublitz J, MD 12/05/16 [phone]

## 2016-12-04 NOTE — ED Triage Notes (Signed)
Sick with N/V/D since 2 hours ago [employer] at [employer]  [facility] [place]

## 2016-12-05 LAB — CBC WITH DIFFERENTIAL/PLATELET
BASOS ABS: 0 10*3/uL (ref 0.0–0.1)
Basophils Relative: 0 %
EOS PCT: 1 %
Eosinophils Absolute: 0.2 10*3/uL (ref 0.0–0.7)
HCT: 37.9 % — ABNORMAL LOW (ref 39.0–52.0)
Hemoglobin: 12.4 g/dL — ABNORMAL LOW (ref 13.0–17.0)
LYMPHS PCT: 11 %
Lymphs Abs: 1.7 10*3/uL (ref 0.7–4.0)
MCH: 26.5 pg (ref 26.0–34.0)
MCHC: 32.7 g/dL (ref 30.0–36.0)
MCV: 81 fL (ref 78.0–100.0)
Monocytes Absolute: 1 10*3/uL (ref 0.1–1.0)
Monocytes Relative: 6 %
Neutro Abs: 12.2 10*3/uL — ABNORMAL HIGH (ref 1.7–7.7)
Neutrophils Relative %: 82 %
Platelets: 231 10*3/uL (ref 150–400)
RBC: 4.68 MIL/uL (ref 4.22–5.81)
RDW: 14 % (ref 11.5–15.5)
WBC: 15 10*3/uL — ABNORMAL HIGH (ref 4.0–10.5)

## 2016-12-05 LAB — COMPREHENSIVE METABOLIC PANEL
ALT: 37 U/L (ref 17–63)
AST: 30 U/L (ref 15–41)
Albumin: 4.1 g/dL (ref 3.5–5.0)
Alkaline Phosphatase: 67 U/L (ref 38–126)
Anion gap: 9 (ref 5–15)
BUN: 10 mg/dL (ref 6–20)
CO2: 24 mmol/L (ref 22–32)
CREATININE: 0.79 mg/dL (ref 0.61–1.24)
Calcium: 9.2 mg/dL (ref 8.9–10.3)
Chloride: 105 mmol/L (ref 101–111)
GFR calc Af Amer: >60 mL/min (ref >60)
GLUCOSE: 219 mg/dL — AB (ref 65–99)
Potassium: 4.2 mmol/L (ref 3.5–5.1)
Sodium: 138 mmol/L (ref 135–145)
Total Bilirubin: 0.5 mg/dL (ref 0.3–1.2)
Total Protein: 8 g/dL (ref 6.5–8.1)

## 2016-12-05 LAB — LIPASE, BLOOD: Lipase: 42 U/L (ref 11–51)

## 2016-12-05 MED ORDER — APIXABAN 5 MG PO TABS: 5.0000 mg | ORAL_TABLET | Freq: Two times a day (BID) | ORAL | 0 refills | Status: DC

## 2016-12-05 MED ORDER — METOPROLOL TARTRATE 50 MG PO TABS
50.0000 mg | ORAL_TABLET | Freq: Once | ORAL | Status: DC
  Filled 2016-12-05: qty 1

## 2016-12-05 MED ORDER — METOPROLOL TARTRATE 5 MG/5ML IV SOLN
5.0000 mg | Freq: Once | INTRAVENOUS | Status: AC
  Administered 2016-12-05: 5 mg via INTRAVENOUS
  Filled 2016-12-05: qty 5

## 2016-12-05 MED ORDER — METOPROLOL TARTRATE 25 MG PO TABS: 25.0000 mg | ORAL_TABLET | Freq: Two times a day (BID) | ORAL | 0 refills | Status: DC

## 2016-12-05 MED ORDER — ONDANSETRON HCL 4 MG PO TABS: 4.0000 mg | ORAL_TABLET | Freq: Four times a day (QID) | ORAL | 0 refills | Status: DC

## 2016-12-05 MED ORDER — APIXABAN 5 MG PO TABS
5.0000 mg | ORAL_TABLET | ORAL | Status: AC
  Administered 2016-12-05: 5 mg via ORAL
  Filled 2016-12-05: qty 1

## 2016-12-05 MED ORDER — DIPHENOXYLATE-ATROPINE 2.5-0.025 MG PO TABS: 2.0000 | ORAL_TABLET | Freq: Four times a day (QID) | ORAL | 0 refills | Status: DC | PRN

## 2016-12-05 MED ORDER — METOPROLOL TARTRATE 25 MG PO TABS
25.0000 mg | ORAL_TABLET | Freq: Once | ORAL | Status: AC
  Administered 2016-12-05: 25 mg via ORAL

## 2016-12-05 MED ORDER — APIXABAN 5 MG PO TABS
ORAL_TABLET | ORAL | Status: AC
  Filled 2016-12-05: qty 1

## 2016-12-14 ENCOUNTER — Ambulatory Visit: Payer: BLUE CROSS/BLUE SHIELD | Admitting: Cardiovascular Disease

## 2016-12-22 ENCOUNTER — Encounter: Payer: Self-pay | Admitting: Cardiovascular Disease

## 2016-12-22 ENCOUNTER — Ambulatory Visit: Payer: BLUE CROSS/BLUE SHIELD | Admitting: Cardiovascular Disease

## 2016-12-24 ENCOUNTER — Ambulatory Visit: Payer: BLUE CROSS/BLUE SHIELD | Admitting: Cardiology

## 2017-01-28 ENCOUNTER — Encounter: Payer: Self-pay | Admitting: Cardiovascular Disease

## 2017-01-28 ENCOUNTER — Ambulatory Visit: Payer: BLUE CROSS/BLUE SHIELD | Admitting: Cardiovascular Disease

## 2017-02-09 ENCOUNTER — Emergency Department (HOSPITAL_COMMUNITY)
Admission: EM | Admit: 2017-02-09 | Discharge: 2017-02-09 | Disposition: A | Discharge status: Home / Self Care | Payer: BLUE CROSS/BLUE SHIELD | Attending: Emergency Medicine | Admitting: Emergency Medicine

## 2017-02-09 ENCOUNTER — Encounter (HOSPITAL_COMMUNITY): Payer: Self-pay | Admitting: Emergency Medicine

## 2017-02-09 DIAGNOSIS — J020 Streptococcal pharyngitis: Secondary | ICD-10-CM | POA: Insufficient documentation

## 2017-02-09 DIAGNOSIS — J029 Acute pharyngitis, unspecified: Secondary | ICD-10-CM | POA: Diagnosis present

## 2017-02-09 DIAGNOSIS — Z87891 Personal history of nicotine dependence: Secondary | ICD-10-CM | POA: Diagnosis not present

## 2017-02-09 DIAGNOSIS — Z7984 Long term (current) use of oral hypoglycemic drugs: Secondary | ICD-10-CM | POA: Diagnosis not present

## 2017-02-09 DIAGNOSIS — E119 Type 2 diabetes mellitus without complications: Secondary | ICD-10-CM | POA: Diagnosis not present

## 2017-02-09 LAB — CBG MONITORING, ED: GLUCOSE-CAPILLARY: 128 mg/dL — AB (ref 65–99)

## 2017-02-09 LAB — RAPID STREP SCREEN (MED CTR MEBANE ONLY): Streptococcus, Group A Screen (Direct): POSITIVE — AB

## 2017-02-09 MED ORDER — DEXAMETHASONE SODIUM PHOSPHATE 4 MG/ML IJ SOLN
10.0000 mg | Freq: Once | INTRAMUSCULAR | Status: AC
  Administered 2017-02-09: 10 mg via INTRAMUSCULAR
  Filled 2017-02-09: qty 3

## 2017-02-09 MED ORDER — ACETAMINOPHEN ER 650 MG PO TBCR: 650.0000 mg | EXTENDED_RELEASE_TABLET | Freq: Three times a day (TID) | ORAL | 0 refills | Status: DC | PRN

## 2017-02-09 MED ORDER — PENICILLIN G BENZATHINE 1200000 UNIT/2ML IM SUSP
1.2000 10*6.[IU] | Freq: Once | INTRAMUSCULAR | Status: AC
  Administered 2017-02-09: 1.2 10*6.[IU] via INTRAMUSCULAR
  Filled 2017-02-09: qty 2

## 2017-02-09 NOTE — ED Triage Notes (Signed)
Pt c/o righ side of neck hurting and sore throat x 2 days. Pt has mild swelling noted to right side of face. Right side of throat is severly enlarged and covering over half his throat. Pt can swallow and denies sob

## 2017-02-09 NOTE — ED Provider Notes (Signed)
[facility]-EMERGENCY DEPT Provider Note   CSN: [id number] Arrival date & time: 02/09/17  1331     History   Chief Complaint Chief Complaint  Patient presents with  . Sore Throat    HPI Casey Williams is a 36 y.o. male.  HPI  Pt comes in with cc of sore throat. Pt has hx of DM. Pt reports that his sore throat started last week and overtime it has gotten worse. Pt now has pain with swallowing and speaking. Pt has no drooling, difficulty swallowing, shortness of breath.   Past Medical History:  Diagnosis Date  . Bright red rectal bleeding   . Diabetes mellitus without complication (HCC)   . GERD (gastroesophageal reflux disease)   . Peptic ulcer     Patient Active Problem List   Diagnosis Date Noted  . Rectal bleed 09/29/2011  . Normocytic anemia 09/29/2011  . Rectal pain 09/29/2011  . GERD (gastroesophageal reflux disease) 09/29/2011  . Constipation 09/29/2011    Past Surgical History:  Procedure Laterality Date  . COLONOSCOPY    . None         Home Medications    Prior to Admission medications   Medication Sig Start Date End Date Taking? Authorizing Provider  ibuprofen (ADVIL,MOTRIN) 600 MG tablet Take 1 tablet (600 mg total) by mouth every 6 (six) hours as needed. 10/25/14  Yes Derwood KaplanNanavati, Micharl Helmes, MD  metFORMIN (GLUCOPHAGE) 500 MG tablet Take by mouth 2 (two) times daily with a meal.   Yes [provider]  apixaban (ELIQUIS) 5 MG TABS tablet Take 1 tablet (5 mg total) by mouth 2 (two) times daily. Patient not taking: Reported on 02/09/2017 12/05/16   Gilda CreasePollina, Christopher J, MD  diphenoxylate-atropine (LOMOTIL) 2.5-0.025 MG tablet Take 2 tablets by mouth 4 (four) times daily as needed for diarrhea or loose stools. Patient not taking: Reported on 02/09/2017 12/05/16   Gilda CreasePollina, Christopher J, MD  metoprolol tartrate (LOPRESSOR) 25 MG tablet Take 1 tablet (25 mg total) by mouth 2 (two) times daily. Patient not taking: Reported on 02/09/2017 12/05/16   Gilda CreasePollina,  Christopher J, MD  ondansetron (ZOFRAN ODT) 4 MG disintegrating tablet Take 1 tablet (4 mg total) by mouth every 8 (eight) hours as needed for nausea or vomiting. Patient not taking: Reported on 02/09/2017 06/22/15   Devoria AlbeKnapp, Iva, MD  ondansetron (ZOFRAN) 4 MG tablet Take 1 tablet (4 mg total) by mouth every 6 (six) hours. Patient not taking: Reported on 02/09/2017 12/05/16   Gilda CreasePollina, Christopher J, MD  oxyCODONE-acetaminophen (PERCOCET) 5-325 MG per tablet Take 2 tablets by mouth every 4 (four) hours as needed. Patient not taking: Reported on 02/09/2017 06/26/13   Donnetta Hutchingook, Brian, MD  pantoprazole (PROTONIX) 40 MG tablet Take 1 tablet (40 mg total) by mouth daily. Patient not taking: Reported on 02/09/2017 09/20/14   Dione BoozeGlick, David, MD  penicillin v potassium (VEETID) 500 MG tablet Take 1 tablet (500 mg total) by mouth 4 (four) times daily. Patient not taking: Reported on 02/09/2017 11/18/15   Devoria AlbeKnapp, Iva, MD  promethazine (PHENERGAN) 25 MG tablet Take 1 tablet (25 mg total) by mouth every 6 (six) hours as needed for nausea. 05/27/11 06/03/11  Bethann BerkshireZammit, Joseph, MD  promethazine (PHENERGAN) 25 MG tablet Take 1 tablet (25 mg total) by mouth every 6 (six) hours as needed for nausea or vomiting. Patient not taking: Reported on 02/09/2017 06/26/13   Donnetta Hutchingook, Brian, MD  traMADol Janean Sark(ULTRAM) 50 MG tablet Take one every 6 hours for pain not helped by tylenol  Patient not taking: Reported on 02/09/2017 06/14/16   Bethann BerkshireZammit, Joseph, MD    Family History Family History  Problem Relation Age of Onset  . Cancer Mother        deceased age 37, ?lung  . Colon cancer Neg Hx   . Liver disease Neg Hx     Social History Social History  Substance Use Topics  . Smoking status: Former Smoker    Types: Cigars    Quit date: 09/29/2006  . Smokeless tobacco: Never Used     Comment: smoked cigars 2 daily for 8 years  . Alcohol use Yes     Comment: socially, one beer on weekend     Allergies   Aspirin   Review of Systems Review of  Systems  Constitutional: Negative for activity change.  HENT: Positive for sore throat. Negative for trouble swallowing and voice change.   Respiratory: Negative for cough, shortness of breath and stridor.   Cardiovascular: Negative for chest pain.     Physical Exam Updated Vital Signs BP 123/73 (BP Location: Right Arm)   Pulse 80   Temp 98.3 F (36.8 C) (Oral)   Resp 18   Ht 5' 10.5" (1.791 m)   Wt 124.7 kg (275 lb)   SpO2 99%   BMI 38.90 kg/m   Physical Exam  Constitutional: He is oriented to person, place, and time. He appears well-developed.  HENT:  Head: Atraumatic.  Neck: Neck supple.   Pt has no trismus, stridor or crepitus over the neck. Pt has bilateral tonsillar enlargement and exudates. Pharynx is erythematous.   Cardiovascular: Normal rate.   Pulmonary/Chest: Effort normal.  Lymphadenopathy:    He has no cervical adenopathy.  Neurological: He is alert and oriented to person, place, and time.  Skin: Skin is warm.  Nursing note and vitals reviewed.    ED Treatments / Results  Labs (all labs ordered are listed, but only abnormal results are displayed) Labs Reviewed  RAPID STREP SCREEN (NOT AT [facility]) - Abnormal; Notable for the following:       Result Value   Streptococcus, Group A Screen (Direct) POSITIVE (*)    All other components within normal limits  CBG MONITORING, ED - Abnormal; Notable for the following:    Glucose-Capillary 128 (*)    All other components within normal limits    EKG  EKG Interpretation None       Radiology No results found.  Procedures Procedures (including critical care time)  Medications Ordered in ED Medications  penicillin g benzathine (BICILLIN LA) 1200000 UNIT/2ML injection 1.2 Million Units (not administered)  dexamethasone (DECADRON) injection 10 mg (not administered)     Initial Impression / Assessment and Plan / ED Course  I have reviewed the triage vital signs and the nursing notes.  Pertinent labs  & imaging results that were available during my care of the patient were reviewed by me and considered in my medical decision making (see chart for details).     Pt comes in with sore throat. No red flags for airway compromise. Pt has + strep.  We will give im penicillin and im decadron.  Final Clinical Impressions(s) / ED Diagnoses   Final diagnoses:  Acute streptococcal pharyngitis    New Prescriptions New Prescriptions   No medications on file     Derwood KaplanNanavati, Hoang Pettingill, MD 02/09/17 [phone]

## 2017-02-09 NOTE — Discharge Instructions (Signed)
Please take tylenol round the clock for pain. Salt water gargles before you go to sleep will he helpful.  Return to the Er if there is any drooling, difficulty breathing, worsening pain / swelling.

## 2017-02-24 ENCOUNTER — Ambulatory Visit (INDEPENDENT_AMBULATORY_CARE_PROVIDER_SITE_OTHER): Payer: BLUE CROSS/BLUE SHIELD | Admitting: Cardiovascular Disease

## 2017-02-24 ENCOUNTER — Encounter: Payer: Self-pay | Admitting: Cardiovascular Disease

## 2017-02-24 VITALS — BP 120/86 | HR 90 | Ht 70.5 in | Wt 262.0 lb

## 2017-02-24 DIAGNOSIS — E119 Type 2 diabetes mellitus without complications: Secondary | ICD-10-CM

## 2017-02-24 DIAGNOSIS — I480 Paroxysmal atrial fibrillation: Secondary | ICD-10-CM

## 2017-02-24 DIAGNOSIS — K219 Gastro-esophageal reflux disease without esophagitis: Secondary | ICD-10-CM

## 2017-02-24 MED ORDER — OMEPRAZOLE 20 MG PO CPDR: 20.0000 mg | DELAYED_RELEASE_CAPSULE | Freq: Every day | ORAL | 0 refills | Status: DC

## 2017-02-24 NOTE — Patient Instructions (Signed)
Medication Instructions:  Your physician has recommended you make the following change in your medication:  Start Omeprazole 20 mg Daily    Labwork: NONE  Testing/Procedures: Your physician has requested that you have an echocardiogram. Echocardiography is a painless test that uses sound waves to create images of your heart. It provides your doctor with information about the size and shape of your heart and how well your heart's chambers and valves are working. This procedure takes approximately one hour. There are no restrictions for this procedure.    Follow-Up: Your physician wants you to follow-up in: 1 Year with Dr. Abigail ButtsKonewaran. You will receive a reminder letter in the mail two months in advance. If you don't receive a letter, please call our office to schedule the follow-up appointment.   Any Other Special Instructions Will Be Listed Below (If Applicable).     If you need a refill on your cardiac medications before your next appointment, please call your pharmacy.  Thank you for choosing [facility]!

## 2017-02-24 NOTE — Progress Notes (Signed)
CARDIOLOGY CONSULT NOTE  Patient ID: Casey Williams MRN: [id number] DOB/AGE: [birth year] 36 y.o.  Admit date: (Not on file) Primary Physician: [facility] Referring Physician: Dr. Leavy Cella  Reason for Consultation: Atrial fibrillation  HPI: Casey Williams is a 36 y.o. male who is being seen today for the evaluation of atrial fibrillation at the request of Dr. Leavy Cella  I personally reviewed the ECG performed on 12/05/16 which demonstrated atrial fibrillation with rapid ventricular response, 101 bpm.  He has a history of diabetes as well. He is on metformin.  He was in the ED on 12/05/16 for nausea and vomiting which developed after eating at [facility]. He had a leukocytosis with white blood cell count of 15, anemia with hemoglobin of 12.4, and hyperglycemia with blood glucose 219. Other labs showed sodium 138, potassium 4.2, BUN 10, creatinine 0.79.  At the time he did not experience any chest pain, palpitations, dizziness, or shortness of breath. It was an incidental finding.  He works third shift. He used to play basketball quite regularly but has not played since January. He denies exertional chest pain, shortness of breath, orthopnea, leg swelling, and palpitations at the present time.  He has a history of GERD and does get relief with Tums.  He has no history of hypertension.   Allergies  Allergen Reactions  . Aspirin Other (See Comments)    Stomach ulcers    Current Outpatient Prescriptions  Medication Sig Dispense Refill  . metFORMIN (GLUCOPHAGE) 500 MG tablet Take by mouth 2 (two) times daily with a meal.     No current facility-administered medications for this visit.     Past Medical History:  Diagnosis Date  . Bright red rectal bleeding   . Diabetes mellitus without complication (HCC)   . GERD (gastroesophageal reflux disease)   . Peptic ulcer     Past Surgical History:  Procedure Laterality Date  . COLONOSCOPY    . None      Social  History   Social History  . Marital status: Married    Spouse name: N/A  . Number of children: 3  . Years of education: N/A   Occupational History  . [employer]   Social History Main Topics  . Smoking status: Former Smoker    Types: Cigars    Quit date: 09/29/2006  . Smokeless tobacco: Never Used     Comment: smoked cigars 2 daily for 8 years  . Alcohol use Yes     Comment: socially, one beer on weekend  . Drug use: No  . Sexual activity: Not on file   Other Topics Concern  . Not on file   Social History Narrative  . No narrative on file     No family history of premature CAD in 1st degree relatives.  Current Meds  Medication Sig  . metFORMIN (GLUCOPHAGE) 500 MG tablet Take by mouth 2 (two) times daily with a meal.  . [DISCONTINUED] acetaminophen (TYLENOL 8 HOUR) 650 MG CR tablet Take 1 tablet (650 mg total) by mouth every 8 (eight) hours as needed for pain.      Review of systems complete and found to be negative unless listed above in HPI    Physical exam Blood pressure 120/86, pulse 90, height 5' 10.5" (1.791 m), weight 262 lb (118.8 kg), SpO2 97 %. General: NAD Neck: No JVD, no thyromegaly or thyroid nodule.  Lungs: Clear to auscultation bilaterally with normal respiratory effort.  CV: Nondisplaced PMI. Regular rate and rhythm, normal S1/S2, no S3/S4, no murmur.  No peripheral edema.  No carotid bruit.    Abdomen: Soft, nontender, no distention.  Skin: Intact without lesions or rashes.  Neurologic: Alert and oriented x 3.  Psych: Normal affect. Extremities: No clubbing or cyanosis.  HEENT: Normal.   ECG: Most recent ECG reviewed.   Labs: Lab Results  Component Value Date/Time   K 4.2 12/05/2016 12:11 AM   BUN 10 12/05/2016 12:11 AM   CREATININE 0.79 12/05/2016 12:11 AM   ALT 37 12/05/2016 12:11 AM   HGB 12.4 (L) 12/05/2016 12:11 AM     Lipids: No results found for: LDLCALC, LDLDIRECT, CHOL, TRIG, HDL      ASSESSMENT AND PLAN:    1. Paroxysmal atrial fibrillation: He was asymptomatic at the time. He is currently in a regular rhythm. CHADSVASC score is one for diabetes. Thus, anticoagulation is not indicated at this time. I did educate him on the additional risk factors which would increase his CHADSVASC score and thus make him a candidate for anticoagulation. I will order a 2-D echocardiogram with Doppler to evaluate cardiac structure, function, and regional wall motion. I did encourage dietary and exercise modification for weight loss.  2. GERD: I will prescribe omeprazole 20 mg daily for 30 days. I educated him on the importance of exercise and dietary modification for weight loss.  3. Type 2 diabetes: Currently on metformin.  I educated him on the importance of exercise and dietary modification for weight loss.      Disposition: Follow up in 1 yr  Signed: Prentice DockerSuresh Saphire Barnhart, M.D., F.A.C.C.  02/24/2017, 8:47 AM

## 2017-02-24 NOTE — Addendum Note (Signed)
Addended by: Kerney ElbePINNIX, Pasha Gadison G on: 02/24/2017 09:15 AM   Modules accepted: Orders

## 2017-02-25 ENCOUNTER — Ambulatory Visit (HOSPITAL_COMMUNITY): Payer: BLUE CROSS/BLUE SHIELD | Attending: Cardiovascular Disease

## 2017-11-18 ENCOUNTER — Other Ambulatory Visit: Payer: Self-pay

## 2017-11-18 ENCOUNTER — Encounter (HOSPITAL_COMMUNITY): Payer: Self-pay

## 2017-11-18 ENCOUNTER — Emergency Department (HOSPITAL_COMMUNITY)
Admission: EM | Admit: 2017-11-18 | Discharge: 2017-11-18 | Disposition: A | Discharge status: Home / Self Care | Payer: BLUE CROSS/BLUE SHIELD | Attending: Emergency Medicine | Admitting: Emergency Medicine

## 2017-11-18 DIAGNOSIS — Z7984 Long term (current) use of oral hypoglycemic drugs: Secondary | ICD-10-CM | POA: Insufficient documentation

## 2017-11-18 DIAGNOSIS — K0889 Other specified disorders of teeth and supporting structures: Secondary | ICD-10-CM | POA: Insufficient documentation

## 2017-11-18 DIAGNOSIS — E119 Type 2 diabetes mellitus without complications: Secondary | ICD-10-CM | POA: Insufficient documentation

## 2017-11-18 DIAGNOSIS — Z79899 Other long term (current) drug therapy: Secondary | ICD-10-CM | POA: Insufficient documentation

## 2017-11-18 DIAGNOSIS — Z87891 Personal history of nicotine dependence: Secondary | ICD-10-CM | POA: Insufficient documentation

## 2017-11-18 MED ORDER — PENICILLIN V POTASSIUM 500 MG PO TABS: 500.0000 mg | ORAL_TABLET | Freq: Four times a day (QID) | ORAL | 0 refills | Status: AC

## 2017-11-18 MED ORDER — PENICILLIN V POTASSIUM 250 MG PO TABS
500.0000 mg | ORAL_TABLET | Freq: Once | ORAL | Status: AC
  Administered 2017-11-18: 500 mg via ORAL
  Filled 2017-11-18: qty 2

## 2017-11-18 MED ORDER — ACETAMINOPHEN 325 MG PO TABS
650.0000 mg | ORAL_TABLET | Freq: Once | ORAL | Status: AC
  Administered 2017-11-18: 650 mg via ORAL
  Filled 2017-11-18: qty 2

## 2017-11-18 NOTE — ED Provider Notes (Signed)
[facility] EMERGENCY DEPARTMENT Provider Note   CSN: [id number] Arrival date & time: 11/18/17  0544     History   Chief Complaint Chief Complaint  Patient presents with  . Dental Pain    HPI Casey Williams is a 37 y.o. male.  The history is provided by the patient.  Dental Pain   This is a new problem. The current episode started 12 to 24 hours ago. The problem occurs constantly. The problem has been gradually worsening. The pain is moderate. He has tried acetaminophen for the symptoms. The treatment provided no relief.  Patient reports onset of right lower dental pain over the past 24 hours.  No fever/vomiting  Past Medical History:  Diagnosis Date  . Bright red rectal bleeding   . Diabetes mellitus without complication (HCC)   . GERD (gastroesophageal reflux disease)   . Peptic ulcer     Patient Active Problem List   Diagnosis Date Noted  . Rectal bleed 09/29/2011  . Normocytic anemia 09/29/2011  . Rectal pain 09/29/2011  . GERD (gastroesophageal reflux disease) 09/29/2011  . Constipation 09/29/2011    Past Surgical History:  Procedure Laterality Date  . COLONOSCOPY    . None          Home Medications    Prior to Admission medications   Medication Sig Start Date End Date Taking? Authorizing Provider  metFORMIN (GLUCOPHAGE) 500 MG tablet Take by mouth 2 (two) times daily with a meal.    [provider]  omeprazole (PRILOSEC) 20 MG capsule Take 1 capsule (20 mg total) by mouth daily before breakfast. 09/29/11 02/24/18  Tiffany Kocher, PA-C  omeprazole (PRILOSEC) 20 MG capsule Take 1 capsule (20 mg total) by mouth daily. 02/24/17   Laqueta Linden, MD  penicillin v potassium (VEETID) 500 MG tablet Take 1 tablet (500 mg total) by mouth 4 (four) times daily for 7 days. 11/18/17 11/25/17  Zadie Rhine, MD    Family History Family History  Problem Relation Age of Onset  . Cancer Mother        deceased age 6, ?lung  . Colon cancer Neg Hx   .  Liver disease Neg Hx     Social History Social History   Tobacco Use  . Smoking status: Former Smoker    Types: Cigars    Last attempt to quit: 09/29/2006    Years since quitting: 11.1  . Smokeless tobacco: Never Used  . Tobacco comment: smoked cigars 2 daily for 8 years  Substance Use Topics  . Alcohol use: Yes    Comment: socially, one beer on weekend  . Drug use: No     Allergies   Aspirin   Review of Systems Review of Systems  Constitutional: Negative for fever.  HENT: Positive for dental problem.   Gastrointestinal: Negative for vomiting.     Physical Exam Updated Vital Signs BP (!) 147/90 (BP Location: Left Arm)   Pulse 71   Temp 97.7 F (36.5 C) (Oral)   Resp 18   Ht 1.778 m ( )   Wt 122.5 kg (270 lb)   SpO2 97%   BMI 38.74 kg/m   Physical Exam CONSTITUTIONAL: Well developed/well nourished HEAD AND FACE: Normocephalic/atraumatic EYES: EOMI ENMT: Mucous membranes moist.  Poor dentition.  No trismus.  No focal abscess noted.  Tenderness to right lower molar NECK: supple no meningeal signs CV: S1/S2 noted, no murmurs/rubs/gallops noted LUNGS: Lungs are clear to auscultation bilaterally, no apparent distress ABDOMEN: soft,  nontender, no rebound or guarding NEURO: Pt is awake/alert, moves all extremitiesx4 EXTREMITIES:full ROM SKIN: warm, color normal   ED Treatments / Results  Labs (all labs ordered are listed, but only abnormal results are displayed) Labs Reviewed - No data to display  EKG None  Radiology No results found.  Procedures Procedures   Medications Ordered in ED Medications  penicillin v potassium (VEETID) tablet 500 mg (500 mg Oral Given 11/18/17 1610)  acetaminophen (TYLENOL) tablet 650 mg (650 mg Oral Given 11/18/17 9604)     Initial Impression / Assessment and Plan / ED Course  I have reviewed the triage vital signs and the nursing notes. Patient reports he has a dentist he can follow-up with  Final Clinical  Impressions(s) / ED Diagnoses   Final diagnoses:  Pain, dental    ED Discharge Orders        Ordered    penicillin v potassium (VEETID) 500 MG tablet  4 times daily     11/18/17 5409       Zadie Rhine, MD 11/18/17 0630

## 2017-11-18 NOTE — ED Triage Notes (Signed)
Pt reports pain to right lower tooth, thinks is a tooth he has a filling in, taking tylenol with minimal relief

## 2018-06-01 DIAGNOSIS — Z6837 Body mass index (BMI) 37.0-37.9, adult: Secondary | ICD-10-CM | POA: Diagnosis not present

## 2018-06-01 DIAGNOSIS — K219 Gastro-esophageal reflux disease without esophagitis: Secondary | ICD-10-CM | POA: Diagnosis not present

## 2018-06-01 DIAGNOSIS — E119 Type 2 diabetes mellitus without complications: Secondary | ICD-10-CM | POA: Diagnosis not present

## 2018-08-14 DIAGNOSIS — R1110 Vomiting, unspecified: Secondary | ICD-10-CM | POA: Diagnosis not present

## 2018-08-14 DIAGNOSIS — R112 Nausea with vomiting, unspecified: Secondary | ICD-10-CM | POA: Diagnosis not present

## 2018-08-14 DIAGNOSIS — R1084 Generalized abdominal pain: Secondary | ICD-10-CM | POA: Diagnosis not present

## 2018-08-14 DIAGNOSIS — R197 Diarrhea, unspecified: Secondary | ICD-10-CM | POA: Diagnosis not present

## 2018-09-07 ENCOUNTER — Emergency Department (HOSPITAL_COMMUNITY)
Admission: EM | Admit: 2018-09-07 | Discharge: 2018-09-07 | Disposition: A | Discharge status: Home / Self Care | Payer: Commercial Managed Care - PPO | Attending: Emergency Medicine | Admitting: Emergency Medicine

## 2018-09-07 ENCOUNTER — Other Ambulatory Visit: Payer: Self-pay

## 2018-09-07 ENCOUNTER — Encounter (HOSPITAL_COMMUNITY): Payer: Self-pay

## 2018-09-07 DIAGNOSIS — E119 Type 2 diabetes mellitus without complications: Secondary | ICD-10-CM | POA: Diagnosis not present

## 2018-09-07 DIAGNOSIS — Z87891 Personal history of nicotine dependence: Secondary | ICD-10-CM | POA: Diagnosis not present

## 2018-09-07 DIAGNOSIS — Z79899 Other long term (current) drug therapy: Secondary | ICD-10-CM | POA: Diagnosis not present

## 2018-09-07 DIAGNOSIS — R0789 Other chest pain: Secondary | ICD-10-CM | POA: Diagnosis not present

## 2018-09-07 MED ORDER — CYCLOBENZAPRINE HCL 10 MG PO TABS: 10.0000 mg | ORAL_TABLET | Freq: Three times a day (TID) | ORAL | 0 refills | Status: DC

## 2018-09-07 NOTE — Discharge Instructions (Addendum)
Please use a heating pad to the affected area of your chest wall.  Use Flexeril 3 times daily for spasm. This medication may cause drowsiness. Please do not drink, drive, or participate in activity that requires concentration while taking this medication.  Please use 2 extra strength Tylenol with breakfast, lunch, dinner, and at bedtime over the next 2 days.  Please see your physicians at [facility] if not improving.

## 2018-09-07 NOTE — ED Triage Notes (Signed)
Pt believes he has a pulled muscle on the left side of his abdomen. 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 Pain has not gotten any better. Took Tylenol with no pain relief.

## 2018-09-07 NOTE — ED Provider Notes (Signed)
[facility] EMERGENCY DEPARTMENT Provider Note   CSN: [id number] Arrival date & time: 09/07/18  7366    History   Chief Complaint Chief Complaint  Patient presents with  . Muscle Pain    HPI Casey Williams is a 38 y.o. male.     The history is provided by the patient.  Muscle Pain  This is a new problem. The current episode started more than 2 days ago. The problem has been gradually worsening. Pertinent negatives include no chest pain, no abdominal pain and no shortness of breath. Associated symptoms comments: Chest wall pain. The symptoms are aggravated by coughing (turning or lifting arms). Nothing relieves the symptoms. He has tried acetaminophen for the symptoms.    Past Medical History:  Diagnosis Date  . Bright red rectal bleeding   . Diabetes mellitus without complication (HCC)   . GERD (gastroesophageal reflux disease)   . Peptic ulcer     Patient Active Problem List   Diagnosis Date Noted  . Rectal bleed 09/29/2011  . Normocytic anemia 09/29/2011  . Rectal pain 09/29/2011  . GERD (gastroesophageal reflux disease) 09/29/2011  . Constipation 09/29/2011    Past Surgical History:  Procedure Laterality Date  . COLONOSCOPY    . None          Home Medications    Prior to Admission medications   Medication Sig Start Date End Date Taking? Authorizing Provider  metFORMIN (GLUCOPHAGE) 500 MG tablet Take by mouth 2 (two) times daily with a meal.    [provider]  omeprazole (PRILOSEC) 20 MG capsule Take 1 capsule (20 mg total) by mouth daily before breakfast. 09/29/11 02/24/18  Tiffany Kocher, PA-C  omeprazole (PRILOSEC) 20 MG capsule Take 1 capsule (20 mg total) by mouth daily. 02/24/17   Laqueta Linden, MD    Family History Family History  Problem Relation Age of Onset  . Cancer Mother        deceased age 39, ?lung  . Colon cancer Neg Hx   . Liver disease Neg Hx     Social History Social History   Tobacco Use  . Smoking status:  Former Smoker    Types: Cigars    Last attempt to quit: 09/29/2006    Years since quitting: 11.9  . Smokeless tobacco: Never Used  . Tobacco comment: smoked cigars 2 daily for 8 years  Substance Use Topics  . Alcohol use: Yes    Comment: socially, one beer on weekend  . Drug use: No     Allergies   Aspirin   Review of Systems Review of Systems  Constitutional: Negative for activity change.       All ROS Neg except as noted in HPI  HENT: Negative for nosebleeds.   Eyes: Negative for photophobia and discharge.  Respiratory: Negative for cough, shortness of breath and wheezing.   Cardiovascular: Negative for chest pain and palpitations.  Gastrointestinal: Negative for abdominal pain and blood in stool.  Genitourinary: Negative for dysuria, frequency and hematuria.  Musculoskeletal: Positive for myalgias. Negative for arthralgias, back pain and neck pain.  Skin: Negative.   Neurological: Negative for dizziness, seizures and speech difficulty.  Psychiatric/Behavioral: Negative for confusion and hallucinations.     Physical Exam Updated Vital Signs BP (!) 160/76 (BP Location: Left Arm)   Pulse 78   Temp 98 F (36.7 C) (Oral)   Resp 14   Ht 5\' 10"  (1.778 m)   Wt 120.2 kg   SpO2 100%  BMI 38.02 kg/m   Physical Exam Vitals signs and nursing note reviewed.  Constitutional:      Appearance: He is well-developed. He is not toxic-appearing.  HENT:     Head: Normocephalic.     Right Ear: Tympanic membrane and external ear normal.     Left Ear: Tympanic membrane and external ear normal.  Eyes:     General: Lids are normal.     Pupils: Pupils are equal, round, and reactive to light.  Neck:     Musculoskeletal: Normal range of motion and neck supple.     Vascular: No carotid bruit.  Cardiovascular:     Rate and Rhythm: Normal rate and regular rhythm.     Pulses: Normal pulses.     Heart sounds: Normal heart sounds.  Pulmonary:     Effort: No respiratory distress.      Breath sounds: Normal breath sounds.  Chest:    Abdominal:     General: Bowel sounds are normal.     Palpations: Abdomen is soft.     Tenderness: There is no abdominal tenderness. There is no guarding.  Musculoskeletal: Normal range of motion.  Lymphadenopathy:     Head:     Right side of head: No submandibular adenopathy.     Left side of head: No submandibular adenopathy.     Cervical: No cervical adenopathy.  Skin:    General: Skin is warm and dry.  Neurological:     Mental Status: He is alert and oriented to person, place, and time.     Cranial Nerves: No cranial nerve deficit.     Sensory: No sensory deficit.  Psychiatric:        Speech: Speech normal.      ED Treatments / Results  Labs (all labs ordered are listed, but only abnormal results are displayed) Labs Reviewed - No data to display  EKG None  Radiology No results found.  Procedures Procedures (including critical care time)  Medications Ordered in ED Medications - No data to display   Initial Impression / Assessment and Plan / ED Course  I have reviewed the triage vital signs and the nursing notes.  Pertinent labs & imaging results that were available during my care of the patient were reviewed by me and considered in my medical decision making (see chart for details).         Final Clinical Impressions(s) / ED Diagnoses MDM  Blood pressure is elevated at 160/76.  Have asked the patient have this rechecked soon.  The patient does a lot of heavy lifting at his job.  The examination favors a muscle strain of the anterior chest and abdomen.  Pain can be reproduced by attempted range of motion, and taking a deep breath.  Patient will be treated with Flexeril.  The patient is asked to use Tylenol extra strength for soreness.  Have also asked him to use a heating pad, or warm tub soaks.  The patient is in agreement with this plan.   Final diagnoses:  Chest wall pain    ED Discharge Orders          Ordered    cyclobenzaprine (FLEXERIL) 10 MG tablet  3 times daily     09/07/18 0846           Ivery Quale, PA-C 09/08/18 1159    Samuel Jester, [place] 09/09/18 [id number]

## 2018-12-11 ENCOUNTER — Encounter (HOSPITAL_COMMUNITY): Payer: Self-pay | Admitting: *Deleted

## 2018-12-11 ENCOUNTER — Other Ambulatory Visit: Payer: Self-pay

## 2018-12-11 ENCOUNTER — Emergency Department (HOSPITAL_COMMUNITY)
Admission: EM | Admit: 2018-12-11 | Discharge: 2018-12-11 | Disposition: A | Discharge status: Home / Self Care | Payer: Commercial Managed Care - PPO | Attending: Emergency Medicine | Admitting: Emergency Medicine

## 2018-12-11 DIAGNOSIS — K0889 Other specified disorders of teeth and supporting structures: Secondary | ICD-10-CM | POA: Diagnosis present

## 2018-12-11 DIAGNOSIS — Z87891 Personal history of nicotine dependence: Secondary | ICD-10-CM | POA: Diagnosis not present

## 2018-12-11 DIAGNOSIS — K029 Dental caries, unspecified: Secondary | ICD-10-CM | POA: Insufficient documentation

## 2018-12-11 DIAGNOSIS — Z7984 Long term (current) use of oral hypoglycemic drugs: Secondary | ICD-10-CM | POA: Insufficient documentation

## 2018-12-11 DIAGNOSIS — Z79899 Other long term (current) drug therapy: Secondary | ICD-10-CM | POA: Insufficient documentation

## 2018-12-11 DIAGNOSIS — E119 Type 2 diabetes mellitus without complications: Secondary | ICD-10-CM | POA: Insufficient documentation

## 2018-12-11 MED ORDER — CLINDAMYCIN HCL 150 MG PO CAPS: ORAL_CAPSULE | ORAL | 0 refills | Status: DC

## 2018-12-11 NOTE — ED Provider Notes (Signed)
[facility] EMERGENCY DEPARTMENT Provider Note   CSN: [id number] Arrival date & time: 12/11/18  0815    History   Chief Complaint Chief Complaint  Patient presents with   Dental Pain    HPI Casey Williams is a 38 y.o. male.      HPI Pt was seen at 0840. Per pt, c/o gradual onset and persistence of constant right lower tooth "pain" for the past 3 days. Pt states he "broke the tooth" approximately 1 year ago, has not f/u with Dentist.  Denies fevers, no intra-oral edema, no rash, no facial swelling, no dysphagia, no neck pain.   The condition is aggravated by nothing. The condition is relieved by nothing. The symptoms have been associated with no other complaints. The patient has no significant history of serious medical conditions.     Past Medical History:  Diagnosis Date   Bright red rectal bleeding    Diabetes mellitus without complication (HCC)    GERD (gastroesophageal reflux disease)    Peptic ulcer     Patient Active Problem List   Diagnosis Date Noted   Rectal bleed 09/29/2011   Normocytic anemia 09/29/2011   Rectal pain 09/29/2011   GERD (gastroesophageal reflux disease) 09/29/2011   Constipation 09/29/2011    Past Surgical History:  Procedure Laterality Date   COLONOSCOPY     None          Home Medications    Prior to Admission medications   Medication Sig Start Date End Date Taking? Authorizing Provider  cyclobenzaprine (FLEXERIL) 10 MG tablet Take 1 tablet (10 mg total) by mouth 3 (three) times daily. 09/07/18   Ivery QualeBryant, [place], PA-C  metFORMIN (GLUCOPHAGE) 500 MG tablet Take by mouth 2 (two) times daily with a meal.    [provider]  omeprazole (PRILOSEC) 20 MG capsule Take 1 capsule (20 mg total) by mouth daily before breakfast. 09/29/11 02/24/18  Tiffany KocherLewis, Leslie S, PA-C  omeprazole (PRILOSEC) 20 MG capsule Take 1 capsule (20 mg total) by mouth daily. 02/24/17   Laqueta LindenKoneswaran, Suresh A, MD    Family History Family History    Problem Relation Age of Onset   Cancer Mother        deceased age 39, ?lung   Colon cancer Neg Hx    Liver disease Neg Hx     Social History Social History   Tobacco Use   Smoking status: Former Smoker    Types: Cigars    Last attempt to quit: 09/29/2006    Years since quitting: 12.2   Smokeless tobacco: Never Used   Tobacco comment: smoked cigars 2 daily for 8 years  Substance Use Topics   Alcohol use: Yes    Comment: socially, one beer on weekend   Drug use: No     Allergies   Aspirin   Review of Systems Review of Systems ROS: Statement: All systems negative except as marked or noted in the HPI; Constitutional: Negative for fever and chills. ; ; Eyes: Negative for eye pain and discharge. ; ; ENMT: Positive for dental caries, dental hygiene poor and toothache. Negative for ear pain, bleeding gums, dental injury, facial deformity, facial swelling, hoarseness, nasal congestion, sinus pressure, sore throat, throat swelling and tongue swollen. ; ; Cardiovascular: Negative for chest pain, palpitations, diaphoresis, dyspnea and peripheral edema. ; ; Respiratory: Negative for cough, wheezing and stridor. ; ; Gastrointestinal: Negative for nausea, vomiting, diarrhea and abdominal pain. ; ; Genitourinary: Negative for dysuria, flank pain and hematuria. ; ;  Musculoskeletal: Negative for back pain and neck pain. ; ; Skin: Negative for rash and skin lesion. ; ; Neuro: Negative for headache, lightheadedness and neck stiffness. ;    Physical Exam Updated Vital Signs BP (!) 156/91 (BP Location: Right Arm)    Pulse 67    Temp 98.1 F (36.7 C) (Oral)    Resp 14    Ht 5\' 10"  (1.778 m)    Wt 120.2 kg    SpO2 97%    BMI 38.02 kg/m   Physical Exam 0845: Physical examination: Vital signs and O2 SAT: Reviewed; Constitutional: Well developed, Well nourished, Well hydrated, In no acute distress; Head and Face: Normocephalic, Atraumatic; Eyes: EOMI, PERRL, No scleral icterus; ENMT: Mouth and  pharynx normal, Poor dentition, Widespread dental decay, Left TM normal, Right TM normal, Mucous membranes moist, +lower right 2nd molar with dental decay.  No gingival erythema, edema, fluctuance, or drainage.  No intra-oral edema. No submandibular or sublingual edema. No hoarse voice, no drooling, no stridor. No trismus. ; Neck: Supple, Full range of motion, No lymphadenopathy; Cardiovascular: Regular rate and rhythm, No gallop; Respiratory: Breath sounds clear & equal bilaterally, No wheezes, Normal respiratory effort/excursion; Chest: Nontender, Movement normal; Extremities: Pulses normal, No tenderness, No edema; Neuro: AA&Ox3, Major CN grossly intact.  No gross focal motor or sensory deficits in extremities.; Skin: Color normal, No rash, No petechiae, Warm, Dry     ED Treatments / Results  Labs (all labs ordered are listed, but only abnormal results are displayed)   EKG None  Radiology   Procedures Procedures (including critical care time)  Medications Ordered in ED Medications - No data to display   Initial Impression / Assessment and Plan / ED Course  I have reviewed the triage vital signs and the nursing notes.  Pertinent labs & imaging results that were available during my care of the patient were reviewed by me and considered in my medical decision making (see chart for details).       MDM Reviewed: previous chart, nursing note and vitals    0845:  Pt encouraged to f/u with dentist or oral surgeon for his dental needs for good continuity of care and definitive treatment.  Pt verb understanding.     Final Clinical Impressions(s) / ED Diagnoses   Final diagnoses:  None    ED Discharge Orders    None       Samuel Jester, DO 12/15/18 1741

## 2018-12-11 NOTE — ED Triage Notes (Signed)
Patient reports of right bottom tooth pain for 3 days.

## 2018-12-11 NOTE — Discharge Instructions (Signed)
Take the prescriptions as directed.  Call your regular Dentist tomorrow to schedule a follow up appointment within the week.  Return to the Emergency Department immediately sooner if worsening.  ° °

## 2018-12-12 ENCOUNTER — Encounter (HOSPITAL_COMMUNITY): Payer: Self-pay

## 2018-12-12 ENCOUNTER — Other Ambulatory Visit: Payer: Self-pay

## 2018-12-12 ENCOUNTER — Emergency Department (HOSPITAL_COMMUNITY)
Admission: EM | Admit: 2018-12-12 | Discharge: 2018-12-12 | Disposition: A | Discharge status: Home / Self Care | Payer: Commercial Managed Care - PPO | Attending: Emergency Medicine | Admitting: Emergency Medicine

## 2018-12-12 DIAGNOSIS — Z7984 Long term (current) use of oral hypoglycemic drugs: Secondary | ICD-10-CM | POA: Diagnosis not present

## 2018-12-12 DIAGNOSIS — E119 Type 2 diabetes mellitus without complications: Secondary | ICD-10-CM | POA: Insufficient documentation

## 2018-12-12 DIAGNOSIS — Z87891 Personal history of nicotine dependence: Secondary | ICD-10-CM | POA: Insufficient documentation

## 2018-12-12 DIAGNOSIS — K047 Periapical abscess without sinus: Secondary | ICD-10-CM | POA: Diagnosis not present

## 2018-12-12 DIAGNOSIS — K0889 Other specified disorders of teeth and supporting structures: Secondary | ICD-10-CM | POA: Diagnosis present

## 2018-12-12 MED ORDER — HYDROCODONE-ACETAMINOPHEN 5-325 MG PO TABS: 1.0000 | ORAL_TABLET | Freq: Four times a day (QID) | ORAL | 0 refills | Status: DC | PRN

## 2018-12-12 MED ORDER — HYDROCODONE-ACETAMINOPHEN 5-325 MG PO TABS
1.0000 | ORAL_TABLET | Freq: Once | ORAL | Status: AC
  Administered 2018-12-12: 1 via ORAL
  Filled 2018-12-12: qty 1

## 2018-12-12 MED ORDER — NAPROXEN 500 MG PO TABS: 500.0000 mg | ORAL_TABLET | Freq: Two times a day (BID) | ORAL | 0 refills | Status: AC

## 2018-12-12 NOTE — Discharge Instructions (Addendum)
Work note provided.  Make an appointment follow-up with your dentist.  Continue the antibiotics as directed.  Would expect improvement over the next few days.  Take the hydrocodone as needed for severe pain.  Take the Naprosyn for the next 5 days to help with the pain and inflammation.  Due to your history of stomach ulcers in the past do not take the Naprosyn beyond the 5 days.

## 2018-12-12 NOTE — ED Triage Notes (Signed)
Pt reports was seen here yesterday for dental abscess.  Reports started antibiotics yesterday but says is not better.  Pt says he did not get anything for pain.  Has been taking OTC meds without relief.

## 2018-12-12 NOTE — ED Provider Notes (Signed)
[facility] EMERGENCY DEPARTMENT Provider Note   CSN: [id number] Arrival date & time: 12/12/18  6578    History   Chief Complaint Chief Complaint  Patient presents with  . Dental Pain    HPI Casey Williams is a 38 y.o. male.     Patient seen yesterday for same.  Started on clindamycin.  Patient clearly has a dental abscess in the right posterior molar area.  Patient has some bleeding around the cracked tooth in that area.  Patient started taking the clindamycin yesterday no real improvement.  Having a fair amount of pain.  Patient has swelling to the right cheek area and jaw area.     Past Medical History:  Diagnosis Date  . Bright red rectal bleeding   . Diabetes mellitus without complication (HCC)   . GERD (gastroesophageal reflux disease)   . Peptic ulcer     Patient Active Problem List   Diagnosis Date Noted  . Rectal bleed 09/29/2011  . Normocytic anemia 09/29/2011  . Rectal pain 09/29/2011  . GERD (gastroesophageal reflux disease) 09/29/2011  . Constipation 09/29/2011    Past Surgical History:  Procedure Laterality Date  . COLONOSCOPY    . None          Home Medications    Prior to Admission medications   Medication Sig Start Date End Date Taking? Authorizing Provider  clindamycin (CLEOCIN) 150 MG capsule 3 tabs PO TID x 10 days 12/11/18   Samuel Jester, DO  cyclobenzaprine (FLEXERIL) 10 MG tablet Take 1 tablet (10 mg total) by mouth 3 (three) times daily. 09/07/18   Ivery Quale, PA-C  HYDROcodone-acetaminophen (NORCO/VICODIN) 5-325 MG tablet Take 1-2 tablets by mouth every 6 (six) hours as needed for moderate pain or severe pain. 12/12/18   Vanetta Mulders, MD  metFORMIN (GLUCOPHAGE) 500 MG tablet Take by mouth 2 (two) times daily with a meal.    [provider]  naproxen (NAPROSYN) 500 MG tablet Take 1 tablet (500 mg total) by mouth 2 (two) times daily for 5 days. 12/12/18 12/17/18  Vanetta Mulders, MD  omeprazole (PRILOSEC) 20 MG capsule  Take 1 capsule (20 mg total) by mouth daily before breakfast. 09/29/11 02/24/18  Tiffany Kocher, PA-C  omeprazole (PRILOSEC) 20 MG capsule Take 1 capsule (20 mg total) by mouth daily. 02/24/17   Laqueta Linden, MD    Family History Family History  Problem Relation Age of Onset  . Cancer Mother        deceased age 18, ?lung  . Colon cancer Neg Hx   . Liver disease Neg Hx     Social History Social History   Tobacco Use  . Smoking status: Former Smoker    Types: Cigars    Last attempt to quit: 09/29/2006    Years since quitting: 12.2  . Smokeless tobacco: Never Used  . Tobacco comment: smoked cigars 2 daily for 8 years  Substance Use Topics  . Alcohol use: Yes    Comment: socially, one beer on weekend  . Drug use: No     Allergies   Aspirin   Review of Systems Review of Systems  Constitutional: Negative for chills and fever.  HENT: Positive for dental problem. Negative for rhinorrhea, sore throat and trouble swallowing.   Eyes: Negative for visual disturbance.  Respiratory: Negative for cough and shortness of breath.   Cardiovascular: Negative for chest pain and leg swelling.  Gastrointestinal: Negative for abdominal pain, diarrhea, nausea and vomiting.  Genitourinary: Negative for  dysuria.  Musculoskeletal: Negative for back pain and neck pain.  Skin: Negative for rash.  Neurological: Negative for dizziness, light-headedness and headaches.  Hematological: Does not bruise/bleed easily.  Psychiatric/Behavioral: Negative for confusion.     Physical Exam Updated Vital Signs BP (!) 165/95 (BP Location: Right Arm)   Pulse 68   Temp 98.5 F (36.9 C) (Oral)   Resp 20   Ht 1.778 m (5\' 10" )   Wt 120.2 kg   SpO2 98%   BMI 38.02 kg/m   Physical Exam Vitals signs and nursing note reviewed.  Constitutional:      Appearance: He is well-developed.  HENT:     Head: Normocephalic.     Comments: Swelling to the right cheek area.    Mouth/Throat:     Comments:  Oropharynx is clear there is swelling around the avulsed right last molar.  There is some bleeding there.  There is swelling to the cheek area.  No tongue swelling no floor the mouth swelling. Eyes:     Extraocular Movements: Extraocular movements intact.     Conjunctiva/sclera: Conjunctivae normal.     Pupils: Pupils are equal, round, and reactive to light.  Neck:     Musculoskeletal: Normal range of motion and neck supple. No neck rigidity.     Comments: Swelling under the right drawl. Cardiovascular:     Rate and Rhythm: Normal rate and regular rhythm.     Heart sounds: No murmur.  Pulmonary:     Effort: Pulmonary effort is normal. No respiratory distress.     Breath sounds: Normal breath sounds.  Abdominal:     Palpations: Abdomen is soft.     Tenderness: There is no abdominal tenderness.  Musculoskeletal: Normal range of motion.  Skin:    General: Skin is warm and dry.  Neurological:     General: No focal deficit present.     Mental Status: He is alert and oriented to person, place, and time.      ED Treatments / Results  Labs (all labs ordered are listed, but only abnormal results are displayed) Labs Reviewed - No data to display  EKG None  Radiology No results found.  Procedures Procedures (including critical care time)  Medications Ordered in ED Medications  HYDROcodone-acetaminophen (NORCO/VICODIN) 5-325 MG per tablet 1 tablet (has no administration in time range)     Initial Impression / Assessment and Plan / ED Course  I have reviewed the triage vital signs and the nursing notes.  Pertinent labs & imaging results that were available during my care of the patient were reviewed by me and considered in my medical decision making (see chart for details).        Patient clearly has a right lower molar dental abscess.  Clindamycin appropriate antibiotic.  Will supplement with Naprosyn and hydrocodone.  Patient does not have a true allergy to aspirin.  He  was taken off of that because he had stomach ulcer problems in the past.  He used to use those medicines heavily.  Patient has taken none Naprosyn in the past without any difficulties from an allergy standpoint.  We will put him on just a 5-day course of it as well as hydrocodone.  Patient also will follow-up with dentist.  He will return here for any new or worse symptoms.  Final Clinical Impressions(s) / ED Diagnoses   Final diagnoses:  Dental abscess    ED Discharge Orders         Ordered  HYDROcodone-acetaminophen (NORCO/VICODIN) 5-325 MG tablet  Every 6 hours PRN     12/12/18 0822    naproxen (NAPROSYN) 500 MG tablet  2 times daily     12/12/18 6803           Vanetta Mulders, MD 12/12/18 [phone]

## 2019-04-05 ENCOUNTER — Other Ambulatory Visit: Payer: Self-pay

## 2019-04-05 DIAGNOSIS — Z20822 Contact with and (suspected) exposure to covid-19: Secondary | ICD-10-CM

## 2019-04-06 LAB — NOVEL CORONAVIRUS, NAA: SARS-CoV-2, NAA: NOT DETECTED

## 2021-05-11 ENCOUNTER — Emergency Department (HOSPITAL_COMMUNITY): Payer: Self-pay

## 2021-05-11 ENCOUNTER — Emergency Department (HOSPITAL_COMMUNITY)
Admission: EM | Admit: 2021-05-11 | Discharge: 2021-05-11 | Disposition: A | Discharge status: Home / Self Care | Payer: Self-pay | Attending: Student | Admitting: Student

## 2021-05-11 ENCOUNTER — Other Ambulatory Visit: Payer: Self-pay

## 2021-05-11 ENCOUNTER — Encounter (HOSPITAL_COMMUNITY): Payer: Self-pay

## 2021-05-11 DIAGNOSIS — Z87891 Personal history of nicotine dependence: Secondary | ICD-10-CM | POA: Insufficient documentation

## 2021-05-11 DIAGNOSIS — E119 Type 2 diabetes mellitus without complications: Secondary | ICD-10-CM | POA: Insufficient documentation

## 2021-05-11 DIAGNOSIS — Z7984 Long term (current) use of oral hypoglycemic drugs: Secondary | ICD-10-CM | POA: Insufficient documentation

## 2021-05-11 DIAGNOSIS — M79651 Pain in right thigh: Secondary | ICD-10-CM | POA: Insufficient documentation

## 2021-05-11 DIAGNOSIS — M79604 Pain in right leg: Secondary | ICD-10-CM

## 2021-05-11 MED ORDER — LIDOCAINE 5 % EX PTCH
1.0000 | MEDICATED_PATCH | CUTANEOUS | Status: DC
  Administered 2021-05-11: 13:00:00 1 via TRANSDERMAL
  Filled 2021-05-11: qty 1

## 2021-05-11 MED ORDER — KETOROLAC TROMETHAMINE 30 MG/ML IJ SOLN
30.0000 mg | Freq: Once | INTRAMUSCULAR | Status: AC
  Administered 2021-05-11: 13:00:00 30 mg via INTRAVENOUS
  Filled 2021-05-11: qty 1

## 2021-05-11 MED ORDER — HYDROCODONE-ACETAMINOPHEN 5-325 MG PO TABS: 2.0000 | ORAL_TABLET | ORAL | 0 refills | Status: DC | PRN

## 2021-05-11 MED ORDER — HYDROCODONE-ACETAMINOPHEN 5-325 MG PO TABS
1.0000 | ORAL_TABLET | Freq: Once | ORAL | Status: AC
  Administered 2021-05-11: 15:00:00 1 via ORAL
  Filled 2021-05-11: qty 1

## 2021-05-11 NOTE — ED Triage Notes (Signed)
Pt arrived via EMS. Pt states that he has right hamstring pain that radiates down to the bend of the knee. Pt states that pain started 4 months ago and pain has gotten increasingly worse. Pt has been to urgent care for same and taken steroids with no relief.

## 2021-05-11 NOTE — ED Provider Notes (Addendum)
[facility] [place] EMERGENCY DEPARTMENT Provider Note   CSN: [id number] Arrival date & time: 05/11/21  1202     History Chief Complaint  Patient presents with   Leg Pain    Casey Williams is a 40 y.o. male with PMH disease who presents the emergency department for evaluation of right hamstring pain.  Patient states that his pain began 4 months ago and he has been intermittently seen in urgent care providers and given steroid shots that transiently help his pain.  He states that he is having trouble walking due to the pain but denies numbness, tingling, weakness of lower extremity.  Denies any trauma to the lower extremity.  He is currently taking muscle relaxers for his pain that are not helping.   Leg Pain Associated symptoms: no back pain and no fever       Past Medical History:  Diagnosis Date   Bright red rectal bleeding    Diabetes mellitus without complication (HCC)    GERD (gastroesophageal reflux disease)    Peptic ulcer     Patient Active Problem List   Diagnosis Date Noted   Rectal bleed 09/29/2011   Normocytic anemia 09/29/2011   Rectal pain 09/29/2011   GERD (gastroesophageal reflux disease) 09/29/2011   Constipation 09/29/2011    Past Surgical History:  Procedure Laterality Date   COLONOSCOPY     None         Family History  Problem Relation Age of Onset   Cancer Mother        deceased age 78, ?lung   Colon cancer Neg Hx    Liver disease Neg Hx     Social History   Tobacco Use   Smoking status: Former    Types: Cigars    Quit date: 09/29/2006    Years since quitting: 14.6   Smokeless tobacco: Never   Tobacco comments:    smoked cigars 2 daily for 8 years  Vaping Use   Vaping Use: Never used  Substance Use Topics   Alcohol use: Not Currently    Comment: socially, one beer on weekend   Drug use: Yes    Frequency: 7.0 times per week    Types: Marijuana    Home Medications Prior to Admission medications   Medication Sig Start Date End  Date Taking? Authorizing Provider  HYDROcodone-acetaminophen (NORCO/VICODIN) 5-325 MG tablet Take 2 tablets by mouth every 4 (four) hours as needed. 05/11/21  Yes Xerxes Agrusa, MD  metFORMIN (GLUCOPHAGE) 500 MG tablet Take by mouth 2 (two) times daily with a meal.   Yes [provider]  omeprazole (PRILOSEC) 20 MG capsule Take 1 capsule (20 mg total) by mouth daily. Patient taking differently: Take 20 mg by mouth daily as needed. 02/24/17  Yes Herminio Commons, MD  predniSONE (DELTASONE) 10 MG tablet Take 20-60 mg by mouth See admin instructions. Take 60 mg once daily for 2 days, then 50 mg once daily for 2 days, then 40 mg once daily for 2 days, 30 mg once daily for 2 days, then 20 mg once daily for 2 days, then stop 05/06/21  Yes [provider]  tiZANidine (ZANAFLEX) 4 MG tablet Take 4 mg by mouth every 8 (eight) hours as needed. 05/06/21  Yes [provider]  clindamycin (CLEOCIN) 150 MG capsule 3 tabs PO TID x 10 days Patient not taking: No sig reported 12/11/18   Francine Graven, DO  cyclobenzaprine (FLEXERIL) 10 MG tablet Take 1 tablet (10 mg total) by mouth  3 (three) times daily. Patient not taking: No sig reported 09/07/18   Lily Kocher, PA-C  metFORMIN (GLUCOPHAGE-XR) 500 MG 24 hr tablet Take 2,000 mg by mouth daily. Patient not taking: No sig reported 02/05/21   [provider]  omeprazole (PRILOSEC) 20 MG capsule Take 1 capsule (20 mg total) by mouth daily before breakfast. 09/29/11 02/24/18  Mahala Menghini, PA-C    Allergies    Aspirin  Review of Systems   Review of Systems  Constitutional:  Negative for chills and fever.  HENT:  Negative for ear pain and sore throat.   Eyes:  Negative for pain and visual disturbance.  Respiratory:  Negative for cough and shortness of breath.   Cardiovascular:  Negative for chest pain and palpitations.  Gastrointestinal:  Negative for abdominal pain and vomiting.  Genitourinary:  Negative for dysuria  and hematuria.  Musculoskeletal:  Positive for myalgias. Negative for arthralgias and back pain.  Skin:  Negative for color change and rash.  Neurological:  Negative for seizures and syncope.  All other systems reviewed and are negative.  Physical Exam Updated Vital Signs BP (!) 170/123   Pulse 84   Temp 98.9 F (37.2 C) (Oral)   Resp 20   Ht 5\' 10"  (1.778 m)   Wt 122.5 kg   SpO2 97%   BMI 38.74 kg/m   Physical Exam Vitals and nursing note reviewed.  Constitutional:      Appearance: He is well-developed.  HENT:     Head: Normocephalic and atraumatic.  Eyes:     Conjunctiva/sclera: Conjunctivae normal.  Cardiovascular:     Rate and Rhythm: Normal rate and regular rhythm.     Heart sounds: No murmur heard. Pulmonary:     Effort: Pulmonary effort is normal. No respiratory distress.     Breath sounds: Normal breath sounds.  Abdominal:     Palpations: Abdomen is soft.     Tenderness: There is no abdominal tenderness.  Musculoskeletal:        General: Tenderness present.     Cervical back: Neck supple.  Skin:    General: Skin is warm and dry.  Neurological:     Mental Status: He is alert.    ED Results / Procedures / Treatments   Labs (all labs ordered are listed, but only abnormal results are displayed) Labs Reviewed - No data to display  EKG None  Radiology DG Femur Min 2 Views Right  Result Date: 05/11/2021 CLINICAL DATA:  Right thigh pain for several months without known injury. EXAM: RIGHT FEMUR 2 VIEWS COMPARISON:  None. FINDINGS: There is no evidence of fracture or other focal bone lesions. Soft tissues are unremarkable. IMPRESSION: Negative. Electronically Signed   By: Marijo Conception M.D.   On: 05/11/2021 13:57    Procedures Procedures   Medications Ordered in ED Medications  lidocaine (LIDODERM) 5 % 1 patch (1 patch Transdermal Patch Applied 05/11/21 1303)  ketorolac (TORADOL) 30 MG/ML injection 30 mg (30 mg Intravenous Given 05/11/21 1307)   HYDROcodone-acetaminophen (NORCO/VICODIN) 5-325 MG per tablet 1 tablet (1 tablet Oral Given 05/11/21 1508)    ED Course  I have reviewed the triage vital signs and the nursing notes.  Pertinent labs & imaging results that were available during my care of the patient were reviewed by me and considered in my medical decision making (see chart for details).    MDM Rules/Calculators/A&P  Patient seen emergency department for evaluation of right leg pain.  Physical exam reveals tenderness over the hamstring and in the IT band on the right but is otherwise unremarkable.  Patient has full range of motion of the lower extremity.  He was given Toradol and lidocaine patch which minimally improved his pain.  He was given a single Norco and his pain improve dramatically.  X-ray unremarkable.  Patient was then discharged with a very short course of Norco for breakthrough pain only and he will follow-up with the outpatient physical therapy provider tomorrow. Final Clinical Impression(s) / ED Diagnoses Final diagnoses:  Right leg pain    Rx / DC Orders ED Discharge Orders          Ordered    HYDROcodone-acetaminophen (NORCO/VICODIN) 5-325 MG tablet  Every 4 hours PRN        05/11/21 1553             Kiffany Schelling, Wyn Forster, MD 05/11/21 1556    Anival Pasha, [place], MD 05/11/21 1556

## 2021-12-19 IMAGING — DX DG FEMUR 2+V*R*
4 series · 4 of 4 positions shown · non-contrast
Comparison: None.

CLINICAL DATA: Right thigh pain for several months without known
injury.

EXAM:
RIGHT FEMUR 2 VIEWS

[femur ap (1 of 2)]
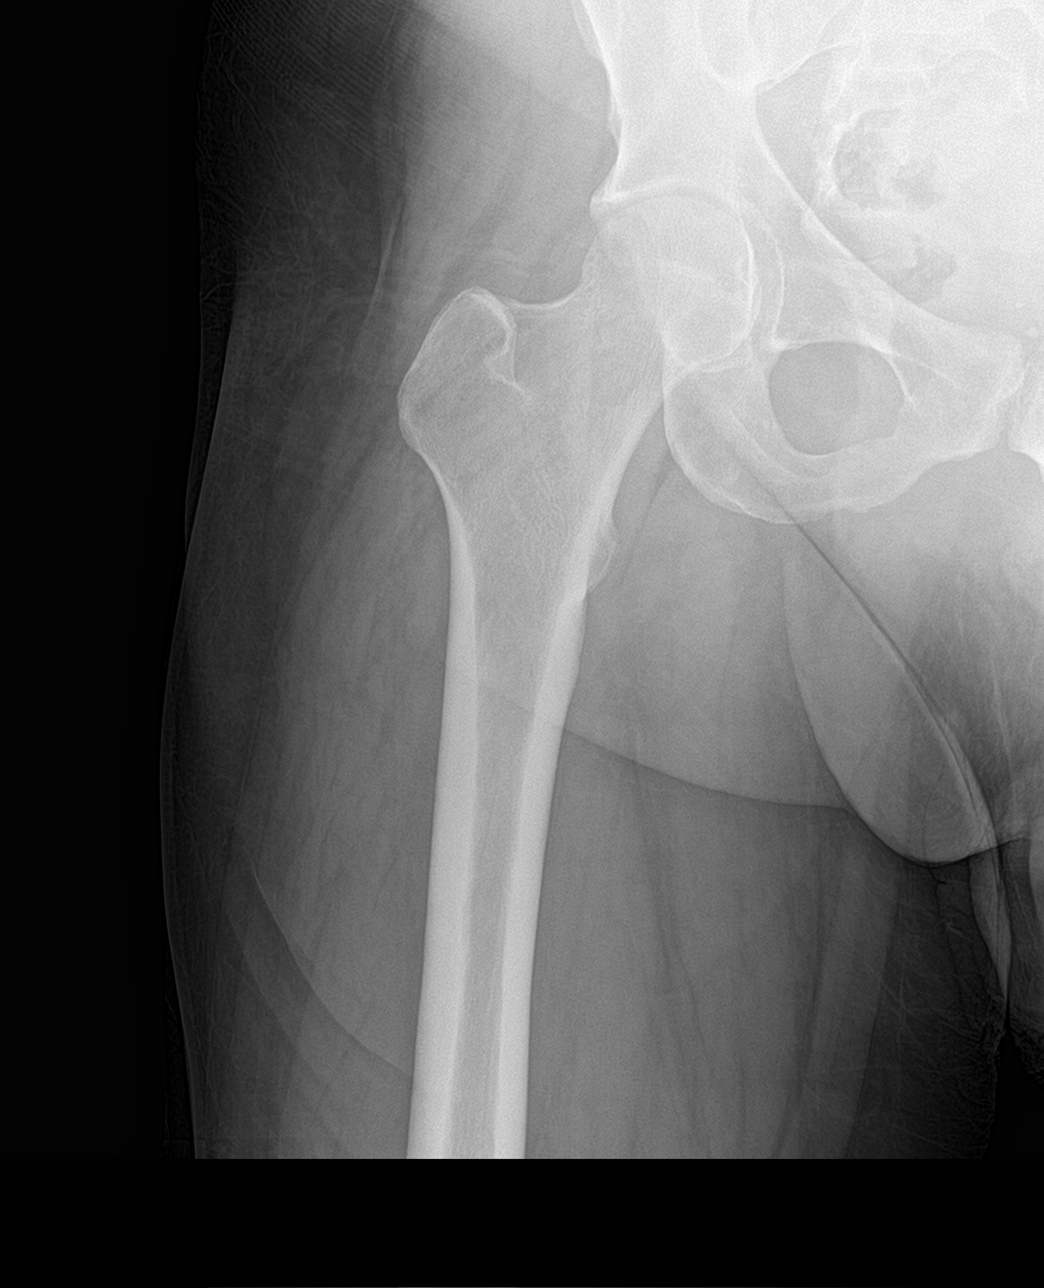

[femur ap (2 of 2)]
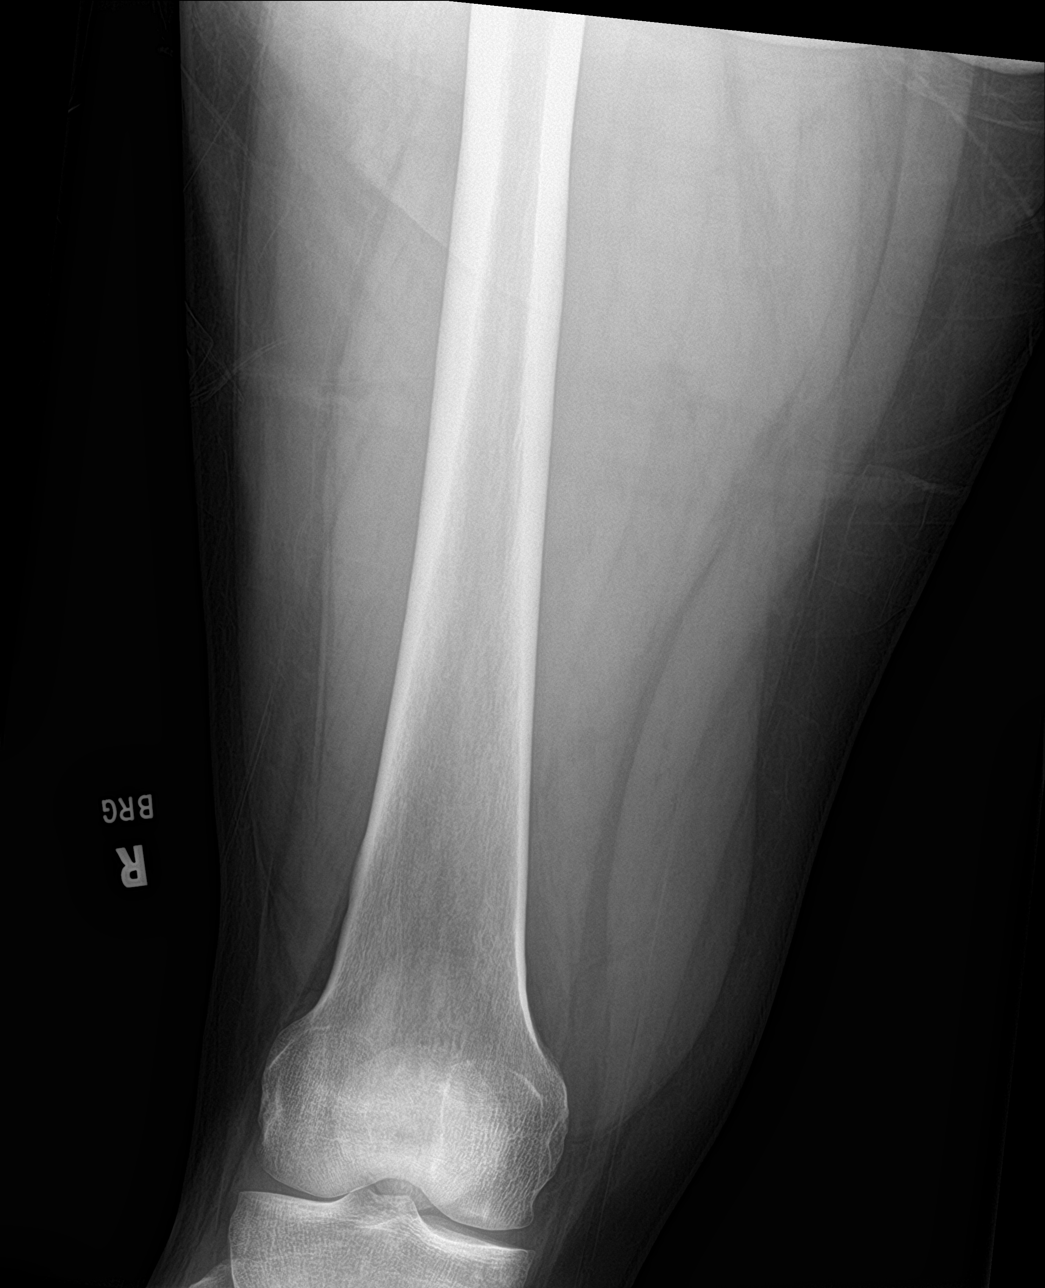

[femur lat (1 of 2)]
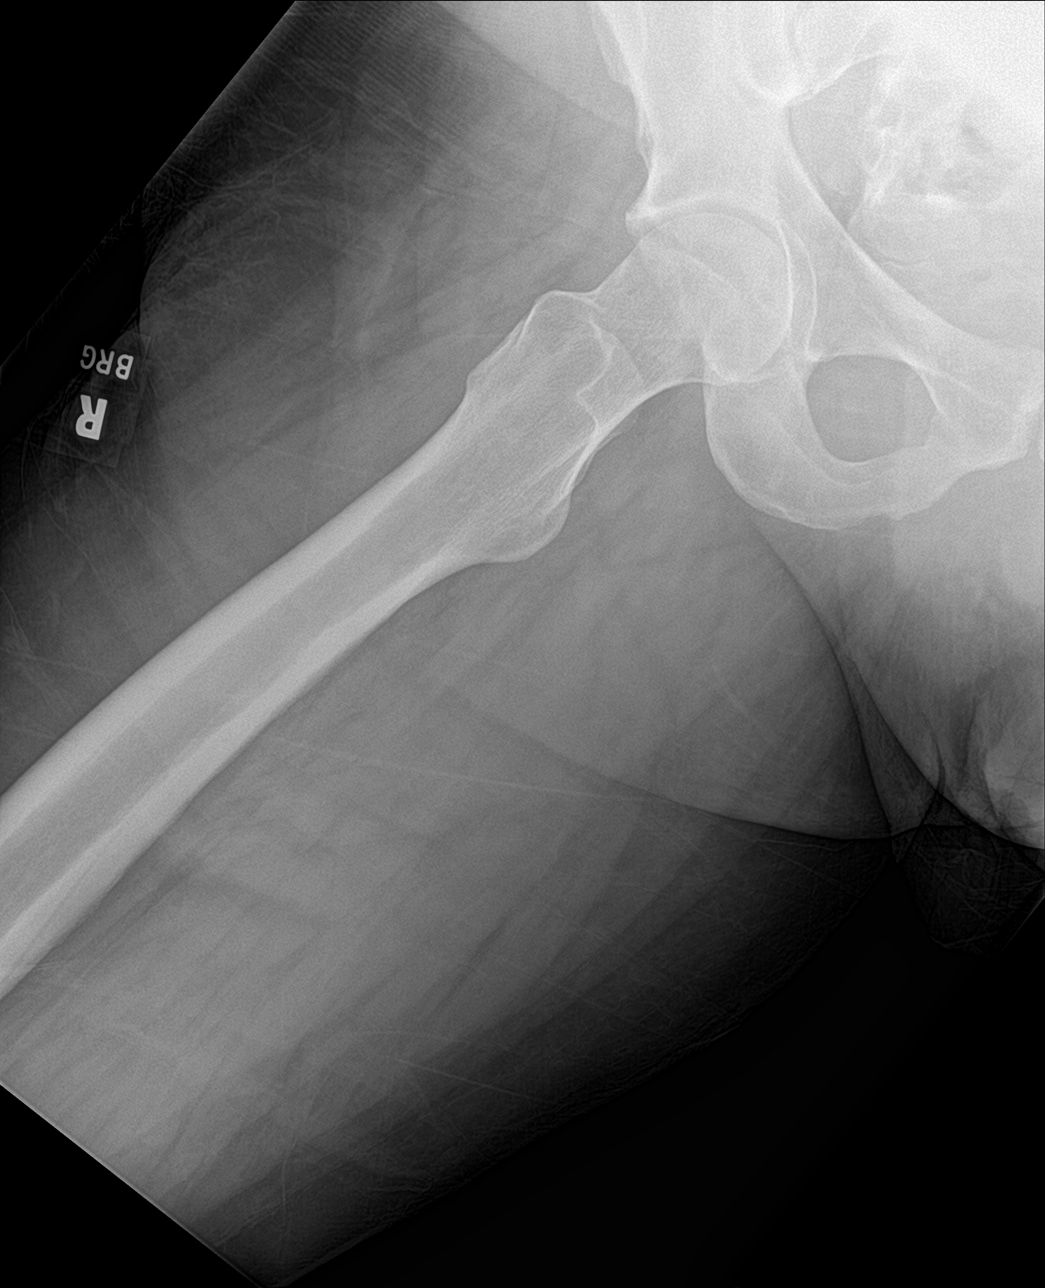

[femur lat (2 of 2)]
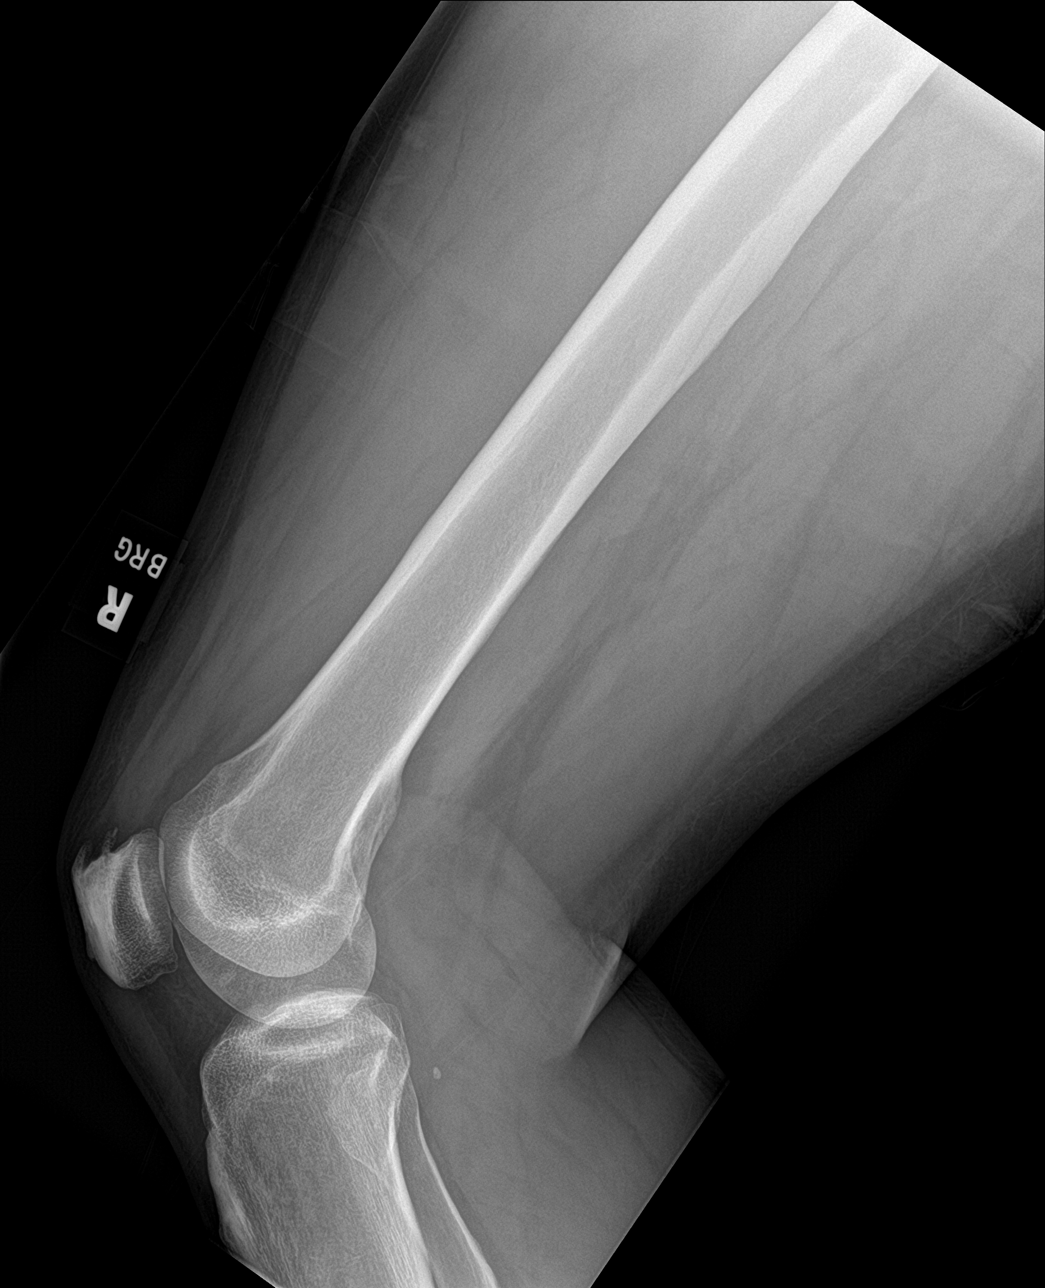

[4 of 4 positions shown; findings below may reference images not displayed]

FINDINGS: There is no evidence of fracture or other focal bone lesions. Soft
tissues are unremarkable.
IMPRESSION: Negative.

## 2022-06-12 ENCOUNTER — Other Ambulatory Visit: Payer: Self-pay | Admitting: Physician Assistant

## 2022-06-12 DIAGNOSIS — R1110 Vomiting, unspecified: Secondary | ICD-10-CM

## 2022-06-12 DIAGNOSIS — R1013 Epigastric pain: Secondary | ICD-10-CM

## 2022-06-12 DIAGNOSIS — R140 Abdominal distension (gaseous): Secondary | ICD-10-CM

## 2022-06-26 ENCOUNTER — Ambulatory Visit
Admission: RE | Admit: 2022-06-26 | Discharge: 2022-06-26 | Disposition: A | Discharge status: Home / Self Care | Payer: PRIVATE HEALTH INSURANCE | Source: Ambulatory Visit | Attending: Physician Assistant | Admitting: Physician Assistant

## 2022-06-26 DIAGNOSIS — R1110 Vomiting, unspecified: Secondary | ICD-10-CM

## 2022-06-26 DIAGNOSIS — R140 Abdominal distension (gaseous): Secondary | ICD-10-CM

## 2022-06-26 DIAGNOSIS — R1013 Epigastric pain: Secondary | ICD-10-CM

## 2022-07-16 ENCOUNTER — Encounter: Payer: Self-pay | Admitting: *Deleted

## 2022-07-17 ENCOUNTER — Encounter: Payer: Self-pay | Admitting: Internal Medicine

## 2022-07-17 ENCOUNTER — Ambulatory Visit: Payer: PRIVATE HEALTH INSURANCE | Attending: Internal Medicine | Admitting: Internal Medicine

## 2022-07-17 VITALS — BP 124/78 | HR 82 | Ht 70.0 in | Wt 256.6 lb

## 2022-07-17 DIAGNOSIS — Z136 Encounter for screening for cardiovascular disorders: Secondary | ICD-10-CM | POA: Diagnosis not present

## 2022-07-17 DIAGNOSIS — I4891 Unspecified atrial fibrillation: Secondary | ICD-10-CM

## 2022-07-17 NOTE — Patient Instructions (Addendum)
Medication Instructions:  Your physician recommends that you continue on your current medications as directed. Please refer to the Current Medication list given to you today.  Labwork: none  Testing/Procedures: Your physician has requested that you have an echocardiogram. Echocardiography is a painless test that uses sound waves to create images of your heart. It provides your doctor with information about the size and shape of your heart and how well your heart's chambers and valves are working. This procedure takes approximately one hour. There are no restrictions for this procedure. Please do NOT wear cologne, perfume, aftershave, or lotions (deodorant is allowed). Please arrive 15 minutes prior to your appointment time.  Follow-Up: Your physician recommends that you schedule a follow-up appointment in: as needed  Any Other Special Instructions Will Be Listed Below (If Applicable).  If you need a refill on your cardiac medications before your next appointment, please call your pharmacy. 

## 2022-07-17 NOTE — Progress Notes (Signed)
Cardiology Office Note  Date: 07/17/2022   ID: Casey Williams, DOB Dec 15, 1980, MRN [id number]  PCP:  [facility]  Cardiologist:  Chalmers Guest, MD Electrophysiologist:  None   Reason for Office Visit: Follow-up of atrial fibrillation   History of Present Illness: Casey Williams is a 42 y.o. male known to have paroxysmal atrial fibrillation diagnosed in 2018 not on [facility], diabetes mellitus type 2 was referred to cardiology clinic for follow-up of atrial fibrillation.  Patient was initially referred to cardiology clinic in 2018 for evaluation of atrial fibrillation.  Patient had incidental finding of A-fib with RVR when he went to the ER in 2018 with symptoms of abdominal pain, nausea, vomiting and diarrhea after eating at [facility].  EKG showed A-fib with RVR, HR 101. Lopressor was given with adequate rate control.  He did not have any symptoms of palpitations or shortness of breath at that time.  He was initially seen in the outpatient clinic with Dr. Bronson Ing in 2018. CHA2DS2-VASc 2 score is 1. He is not initiated on anticoagulation. He was also not on any rate controlling agents due to normal heart rate. He does not have any symptoms of OSA.  He presents today for new patient visit. His PCP wanted him to check with cardiology for A-fib management.  Overall, patient is doing great. No symptoms of angina, DOE, palpitations, dizziness, syncope, LE swelling. Continues to have no symptoms of OSA. Currently not on any rate controlling agents or ACE for paroxysmal A-fib.   Past Medical History:  Diagnosis Date   Bright red rectal bleeding    Diabetes mellitus without complication (HCC)    GERD (gastroesophageal reflux disease)    Peptic ulcer     Past Surgical History:  Procedure Laterality Date   COLONOSCOPY     None      Current Outpatient Medications  Medication Sig Dispense Refill   metFORMIN (GLUCOPHAGE) 500 MG tablet Take 500 mg by mouth 4 (four) times  daily.     omeprazole (PRILOSEC) 40 MG capsule Take 40 mg by mouth daily.     rosuvastatin (CRESTOR) 5 MG tablet Take 5 mg by mouth daily.     No current facility-administered medications for this visit.   Allergies:  Aspirin   Social History: The patient  reports that he quit smoking about 15 years ago. His smoking use included cigars. He has never used smokeless tobacco. He reports that he does not currently use alcohol. He reports current drug use. Frequency: 7.00 times per week. Drug: Marijuana.   Family History: The patient's family history includes Cancer in his mother.   ROS:  Please see the history of present illness. Otherwise, complete review of systems is positive for none.  All other systems are reviewed and negative.   Physical Exam: VS:  BP 124/78   Pulse 82   Ht 5\' 10"  (1.778 m)   Wt 256 lb 9.6 oz (116.4 kg)   SpO2 97%   BMI 36.82 kg/m , BMI Body mass index is 36.82 kg/m.  Wt Readings from Last 3 Encounters:  07/17/22 256 lb 9.6 oz (116.4 kg)  05/11/21 270 lb (122.5 kg)  12/12/18 265 lb (120.2 kg)    General: Patient appears comfortable at rest. HEENT: Conjunctiva and lids normal, oropharynx clear with moist mucosa. Neck: Supple, no elevated JVP or carotid bruits, no thyromegaly. Lungs: Clear to auscultation, nonlabored breathing at rest. Cardiac: Regular rate and rhythm, no S3 or significant systolic murmur,  no pericardial rub. Abdomen: Soft, nontender, no hepatomegaly, bowel sounds present, no guarding or rebound. Extremities: No pitting edema, distal pulses 2+. Skin: Warm and dry. Musculoskeletal: No kyphosis. Neuropsychiatric: Alert and oriented x3, affect grossly appropriate.  ECG:  An ECG dated 07/17/2022 was personally reviewed today and demonstrated:  Normal sinus rhythm  Recent Labwork: No results found for requested labs within last 365 days.  No results found for: "CHOL", "TRIG", "HDL", "CHOLHDL", "VLDL", "LDLCALC", "LDLDIRECT"  Other Studies  Reviewed Today:   Assessment and Plan: Patient is a 42 year old M known to have paroxysmal A-fib not on [facility], DM 2 was referred to cardiology clinic for A-fib management.  # Transient paroxysmal A-fib in 2018 with no recurrence, CV score is 1 -Patient was asymptomatic in 2018 when he was in atrial fibrillation with RVR, HR 101.  It was a transient event in the setting of possible food poisoning from eating at [facility] in 2018. He was given Lopressor with adequate rate control. EKG today showed NSR.  Axis A-fib episode was transient in 2018 in the setting of infection/food poisoning, no indication to be on rate controlling agents or AC. His CHA2DS2-VASc 2 score is also 1 and does not meet criteria for AC. -No symptoms of OSA -Obtain 2D echocardiogram  I have spent a total of 31 minutes with patient reviewing chart , telemetry, EKGs, labs and examining patient as well as establishing an assessment and plan that was discussed with the patient.  > 50% of time was spent in direct patient care.      Medication Adjustments/Labs and Tests Ordered: Current medicines are reviewed at length with the patient today.  Concerns regarding medicines are outlined above.   Tests Ordered: Orders Placed This Encounter  Procedures   EKG 12-Lead   ECHOCARDIOGRAM COMPLETE    Medication Changes: No orders of the defined types were placed in this encounter.   Disposition:  Follow up prn  Signed Tanav Orsak Fidel Levy, MD, 07/17/2022 9:34 AM    [place] [facility] [place], [place], [place] [address]

## 2022-07-30 ENCOUNTER — Ambulatory Visit: Payer: PRIVATE HEALTH INSURANCE | Attending: Internal Medicine

## 2022-07-30 DIAGNOSIS — I4891 Unspecified atrial fibrillation: Secondary | ICD-10-CM

## 2022-07-30 DIAGNOSIS — Z136 Encounter for screening for cardiovascular disorders: Secondary | ICD-10-CM

## 2022-07-30 LAB — ECHOCARDIOGRAM COMPLETE
AR max vel: 2.77 cm2
AV Peak grad: 6.3 mmHg
Ao pk vel: 1.25 m/s
Area-P 1/2: 2.3 cm2
Calc EF: 65.3 %
MV M vel: 2.44 m/s
MV Peak grad: 23.8 mmHg
S' Lateral: 2.9 cm
Single Plane A2C EF: 67.9 %
Single Plane A4C EF: 60.5 %

## 2024-01-13 ENCOUNTER — Other Ambulatory Visit: Payer: Self-pay

## 2024-01-13 ENCOUNTER — Ambulatory Visit
Admission: EM | Admit: 2024-01-13 | Discharge: 2024-01-13 | Disposition: A | Discharge status: Home / Self Care | Attending: Family Medicine | Admitting: Family Medicine

## 2024-01-13 ENCOUNTER — Encounter: Payer: Self-pay | Admitting: Emergency Medicine

## 2024-01-13 DIAGNOSIS — K047 Periapical abscess without sinus: Secondary | ICD-10-CM

## 2024-01-13 MED ORDER — AMOXICILLIN-POT CLAVULANATE 875-125 MG PO TABS: 1.0000 | ORAL_TABLET | Freq: Two times a day (BID) | ORAL | 0 refills | Status: DC

## 2024-01-13 MED ORDER — LIDOCAINE VISCOUS HCL 2 % MT SOLN: 10.0000 mL | OROMUCOSAL | 0 refills | Status: AC | PRN

## 2024-01-13 MED ORDER — CHLORHEXIDINE GLUCONATE 0.12 % MT SOLN: 15.0000 mL | Freq: Two times a day (BID) | OROMUCOSAL | 0 refills | Status: DC

## 2024-01-13 NOTE — ED Triage Notes (Signed)
 Pt reports history of abscess tooth on right side of mouth for Awhile. Pt reports hurts from time to time and most recent episode started x2 days ago.

## 2024-01-13 NOTE — ED Provider Notes (Signed)
 [facility]    CSN: [id number] Arrival date & time: 01/13/24  1007      History   Chief Complaint Chief Complaint  Patient presents with   Dental Pain    HPI Casey Williams is a 43 y.o. male.   Pt reports history of abscess tooth on right side of mouth for Awhile. Pt reports hurts from time to time and most recent episode started x2 days ago.       Past Medical History:  Diagnosis Date   Bright red rectal bleeding    Diabetes mellitus without complication (HCC)    GERD (gastroesophageal reflux disease)    Peptic ulcer     Patient Active Problem List   Diagnosis Date Noted   A-fib (HCC) 07/17/2022   Rectal bleed 09/29/2011   Normocytic anemia 09/29/2011   Rectal pain 09/29/2011   GERD (gastroesophageal reflux disease) 09/29/2011   Constipation 09/29/2011    Past Surgical History:  Procedure Laterality Date   COLONOSCOPY     None         Home Medications    Prior to Admission medications   Medication Sig Start Date End Date Taking? Authorizing Provider  amoxicillin-clavulanate (AUGMENTIN) 875-125 MG tablet Take 1 tablet by mouth every 12 (twelve) hours. 01/13/24  Yes Stuart Vernell Norris, PA-C  chlorhexidine (PERIDEX) 0.12 % solution Use as directed 15 mLs in the mouth or throat 2 (two) times daily. 01/13/24  Yes Stuart Vernell Norris, PA-C  lidocaine  (XYLOCAINE ) 2 % solution Use as directed 10 mLs in the mouth or throat every 3 (three) hours as needed. 01/13/24  Yes Stuart Vernell Norris, PA-C  metFORMIN (GLUCOPHAGE) 500 MG tablet Take 500 mg by mouth 2 (two) times daily.    [provider]  omeprazole  (PRILOSEC) 40 MG capsule Take 40 mg by mouth daily. 06/11/22   [provider]  rosuvastatin (CRESTOR) 5 MG tablet Take 5 mg by mouth daily. 05/26/22   [provider]    Family History Family History  Problem Relation Age of Onset   Cancer Mother        deceased age 21, ?lung   Colon cancer Neg Hx    Liver  disease Neg Hx     Social History Social History   Tobacco Use   Smoking status: Former    Types: Cigars    Quit date: 09/29/2006    Years since quitting: 17.3   Smokeless tobacco: Never   Tobacco comments:    smoked cigars 2 daily for 8 years  Vaping Use   Vaping status: Never Used  Substance Use Topics   Alcohol use: Not Currently    Comment: socially, one beer on weekend   Drug use: Yes    Frequency: 7.0 times per week    Types: Marijuana     Allergies   Aspirin   Review of Systems Review of Systems PER HPI  Physical Exam Triage Vital Signs ED Triage Vitals  Encounter Vitals Group     BP 01/13/24 1023 124/74     Girls Systolic BP Percentile --      Girls Diastolic BP Percentile --      Boys Systolic BP Percentile --      Boys Diastolic BP Percentile --      Pulse Rate 01/13/24 1023 81     Resp 01/13/24 1023 20     Temp 01/13/24 1023 98.3 F (36.8 C)     Temp Source 01/13/24 1023 Oral  SpO2 01/13/24 1023 96 %     Weight --      Height --      Head Circumference --      Peak Flow --      Pain Score 01/13/24 1025 2     Pain Loc --      Pain Education --      Exclude from Growth Chart --    No data found.  Updated Vital Signs BP 124/74 (BP Location: Right Arm)   Pulse 81   Temp 98.3 F (36.8 C) (Oral)   Resp 20   SpO2 96%   Visual Acuity Right Eye Distance:   Left Eye Distance:   Bilateral Distance:    Right Eye Near:   Left Eye Near:    Bilateral Near:     Physical Exam Vitals and nursing note reviewed.  Constitutional:      Appearance: Normal appearance.  HENT:     Head: Atraumatic.     Mouth/Throat:     Mouth: Mucous membranes are moist.     Comments: Small dental abscess present to the lateral part of the lower jaw anterior molars, no active drainage or bleeding Eyes:     Extraocular Movements: Extraocular movements intact.     Conjunctiva/sclera: Conjunctivae normal.  Cardiovascular:     Rate and Rhythm: Normal rate.   Pulmonary:     Effort: Pulmonary effort is normal.  Musculoskeletal:        General: Normal range of motion.     Cervical back: Normal range of motion and neck supple.  Skin:    General: Skin is warm and dry.  Neurological:     Mental Status: He is oriented to person, place, and time.  Psychiatric:        Mood and Affect: Mood normal.        Thought Content: Thought content normal.        Judgment: Judgment normal.      UC Treatments / Results  Labs (all labs ordered are listed, but only abnormal results are displayed) Labs Reviewed - No data to display  EKG   Radiology No results found.  Procedures Procedures (including critical care time)  Medications Ordered in UC Medications - No data to display  Initial Impression / Assessment and Plan / UC Course  I have reviewed the triage vital signs and the nursing notes.  Pertinent labs & imaging results that were available during my care of the patient were reviewed by me and considered in my medical decision making (see chart for details).     Treat with Augmentin, Peridex, viscous lidocaine , supportive over-the-counter medications and home care.  Return for worsening symptoms.  Final Clinical Impressions(s) / UC Diagnoses   Final diagnoses:  Dental abscess   Discharge Instructions   None    ED Prescriptions     Medication Sig Dispense Auth. Provider   amoxicillin-clavulanate (AUGMENTIN) 875-125 MG tablet Take 1 tablet by mouth every 12 (twelve) hours. 14 tablet Stuart Vernell Norris, PA-C   chlorhexidine (PERIDEX) 0.12 % solution Use as directed 15 mLs in the mouth or throat 2 (two) times daily. 120 mL Stuart Vernell Norris, PA-C   lidocaine  (XYLOCAINE ) 2 % solution Use as directed 10 mLs in the mouth or throat every 3 (three) hours as needed. 100 mL Stuart Vernell Norris, [place]      PDMP not reviewed this encounter.   Stuart Vernell Norris, [place] 01/13/24 1541

## 2024-04-19 ENCOUNTER — Ambulatory Visit
Admission: EM | Admit: 2024-04-19 | Discharge: 2024-04-19 | Disposition: A | Discharge status: Home / Self Care | Attending: Family Medicine | Admitting: Family Medicine

## 2024-04-19 DIAGNOSIS — K0889 Other specified disorders of teeth and supporting structures: Secondary | ICD-10-CM

## 2024-04-19 MED ORDER — CHLORHEXIDINE GLUCONATE 0.12 % MT SOLN: 15.0000 mL | Freq: Two times a day (BID) | OROMUCOSAL | 0 refills | Status: AC

## 2024-04-19 MED ORDER — AMOXICILLIN-POT CLAVULANATE 875-125 MG PO TABS: 1.0000 | ORAL_TABLET | Freq: Two times a day (BID) | ORAL | 0 refills | Status: DC

## 2024-04-19 NOTE — ED Provider Notes (Signed)
 [facility]   [id number] 04/19/24 Arrival Time: 1529  ASSESSMENT & PLAN:  1. Pain, dental    No sign of abscess requiring I&D at this time.  Meds ordered this encounter  Medications   amoxicillin -clavulanate (AUGMENTIN ) 875-125 MG tablet    Sig: Take 1 tablet by mouth every 12 (twelve) hours.    Dispense:  14 tablet    Refill:  0   chlorhexidine  (PERIDEX ) 0.12 % solution    Sig: Use as directed 15 mLs in the mouth or throat 2 (two) times daily.    Dispense:  120 mL    Refill:  0   May f/u as needed. Reviewed expectations re: course of current medical issues. Questions answered. Outlined signs and symptoms indicating need for more acute intervention. Patient verbalized understanding. After Visit Summary given.   SUBJECTIVE:  Casey Williams is a 43 y.o. male who reports gradual onset of right lower dental pain described as ache/throb. Present for several days. Fever: denies. Tolerating PO intake but reports pain with chewing. Normal swallowing. He does not see a dentist regularly. No neck swelling or pain. OTC analgesics without relief.    OBJECTIVE: Vitals:   04/19/24 1536  BP: 117/76  Pulse: 68  Resp: 18  Temp: 98.8 F (37.1 C)  SpO2: 96%    General appearance: alert; no distress HENT: normocephalic; atraumatic; dentition: fair; right lower rear gum without areas of fluctuance, drainage, or bleeding and with tenderness to palpation; normal jaw movement without difficulty Neck: supple without LAD; FROM; trachea midline Lungs: normal respirations; unlabored; speaks full sentences without difficulty Skin: warm and dry Psychological: alert and cooperative; normal mood and affect  Allergies  Allergen Reactions   Aspirin Other (See Comments)    Stomach ulcers    Past Medical History:  Diagnosis Date   Bright red rectal bleeding    Diabetes mellitus without complication (HCC)    GERD (gastroesophageal reflux disease)    Peptic ulcer    Social  History   Socioeconomic History   Marital status: Married    Spouse name: Not on file   Number of children: 3   Years of education: Not on file   Highest education level: Not on file  Occupational History   Occupation: [employer]    Employer: [employer]  Tobacco Use   Smoking status: Former    Types: Cigars    Quit date: 09/29/2006    Years since quitting: 17.5   Smokeless tobacco: Never   Tobacco comments:    smoked cigars 2 daily for 8 years  Vaping Use   Vaping status: Never Used  Substance and Sexual Activity   Alcohol use: Not Currently    Comment: socially, one beer on weekend   Drug use: Yes    Frequency: 7.0 times per week    Types: Marijuana   Sexual activity: Yes  Other Topics Concern   Not on file  Social History Narrative   Not on file   Social Drivers of Health   Financial Resource Strain: Not on file  Food Insecurity: Not on file  Transportation Needs: Not on file  Physical Activity: Not on file  Stress: Not on file  Social Connections: Not on file  Intimate Partner Violence: Not on file   Family History  Problem Relation Age of Onset   Cancer Mother        deceased age 71, ?lung   Colon cancer Neg Hx    Liver disease Neg Hx  Past Surgical History:  Procedure Laterality Date   COLONOSCOPY     None        Rolinda Rogue, MD 04/19/24 1654

## 2024-04-19 NOTE — ED Triage Notes (Addendum)
 Pt reports dental pain, swelling on the lower right side, tightness in the jaw x 3 days.

## 2024-07-28 ENCOUNTER — Emergency Department (HOSPITAL_COMMUNITY)
Admission: EM | Admit: 2024-07-28 | Discharge: 2024-07-29 | Disposition: A | Discharge status: Home / Self Care | Attending: Emergency Medicine | Admitting: Emergency Medicine

## 2024-07-28 ENCOUNTER — Encounter (HOSPITAL_COMMUNITY): Payer: Self-pay

## 2024-07-28 ENCOUNTER — Other Ambulatory Visit: Payer: Self-pay

## 2024-07-28 DIAGNOSIS — E119 Type 2 diabetes mellitus without complications: Secondary | ICD-10-CM | POA: Insufficient documentation

## 2024-07-28 DIAGNOSIS — B029 Zoster without complications: Secondary | ICD-10-CM | POA: Diagnosis not present

## 2024-07-28 DIAGNOSIS — Z7984 Long term (current) use of oral hypoglycemic drugs: Secondary | ICD-10-CM | POA: Insufficient documentation

## 2024-07-28 DIAGNOSIS — R21 Rash and other nonspecific skin eruption: Secondary | ICD-10-CM | POA: Diagnosis present

## 2024-07-28 NOTE — ED Triage Notes (Signed)
 Pt from home complains of rash on abdominal area, pt noticed it an hour pta. Rash has unopened blisters, extend from middle of back to middle of sternum, around left side of abdomen. Pt aaox3 and ambulatory, endorse discomfort from blisters, and had chicken pox as a child.

## 2024-07-29 MED ORDER — PREDNISONE 50 MG PO TABS
60.0000 mg | ORAL_TABLET | Freq: Once | ORAL | Status: AC
  Administered 2024-07-29: 60 mg via ORAL
  Filled 2024-07-29: qty 1

## 2024-07-29 MED ORDER — PREDNISONE 50 MG PO TABS: 50.0000 mg | ORAL_TABLET | Freq: Every day | ORAL | 0 refills | Status: AC

## 2024-07-29 MED ORDER — VALACYCLOVIR HCL 500 MG PO TABS
1000.0000 mg | ORAL_TABLET | Freq: Once | ORAL | Status: AC
  Administered 2024-07-29: 1000 mg via ORAL
  Filled 2024-07-29: qty 2

## 2024-07-29 MED ORDER — VALACYCLOVIR HCL 1 G PO TABS: 1000.0000 mg | ORAL_TABLET | Freq: Three times a day (TID) | ORAL | 0 refills | Status: AC

## 2024-07-29 NOTE — ED Notes (Signed)
 ED Provider at bedside.

## 2024-07-29 NOTE — Discharge Instructions (Addendum)
 You have shingles, which is from the same virus that gave you chickenpox.  The rash is likely to get more painful.  If it is sensitive, you may need to keep it covered.  You may take acetaminophen /or ibuprofen  as needed for pain.

## 2024-07-29 NOTE — ED Provider Notes (Signed)
 " [facility] Provider Note   CSN: [id number] Arrival date & time: 07/28/24  2138     Patient presents with: Rash   Casey Williams is a 44 y.o. male.   The history is provided by the patient.  Rash  He has history of diabetes, GERD, peptic ulcer, and comes in because of a rash on his abdomen.  He noticed the rash earlier today and it was right around his umbilicus.  However, his girlfriend noticed that he also has it on his back.  It is starting to have a burning sensation.  He denies any recent exposures.  He does have history of varicella.    Prior to Admission medications  Medication Sig Start Date End Date Taking? Authorizing Provider  amoxicillin -clavulanate (AUGMENTIN ) 875-125 MG tablet Take 1 tablet by mouth every 12 (twelve) hours. 04/19/24   Rolinda Rogue, MD  chlorhexidine  (PERIDEX ) 0.12 % solution Use as directed 15 mLs in the mouth or throat 2 (two) times daily. 04/19/24   Rolinda Rogue, MD  lidocaine  (XYLOCAINE ) 2 % solution Use as directed 10 mLs in the mouth or throat every 3 (three) hours as needed. 01/13/24   Stuart Vernell Norris, PA-C  metFORMIN (GLUCOPHAGE) 500 MG tablet Take 500 mg by mouth 2 (two) times daily.    [provider]  omeprazole  (PRILOSEC) 40 MG capsule Take 40 mg by mouth daily. 06/11/22   [provider]  rosuvastatin (CRESTOR) 5 MG tablet Take 5 mg by mouth daily. 05/26/22   [provider]    Allergies: Aspirin    Review of Systems  Skin:  Positive for rash.  All other systems reviewed and are negative.   Updated Vital Signs BP (!) 139/92   Pulse 77   Temp 97.7 F (36.5 C) (Temporal)   Resp 18   Ht 5' 10 (1.778 m)   Wt 97.5 kg   SpO2 100%   BMI 30.85 kg/m   Physical Exam Vitals and nursing note reviewed.   44 year old male, resting comfortably and in no acute distress. Vital signs are significant for borderline elevated diastolic blood pressure. Oxygen  saturation is 100%, which is normal. Head is normocephalic and atraumatic. PERRLA, EOMI.  Lungs are clear without rales, wheezes, or rhonchi. Heart has regular rate and rhythm without murmur. Abdomen is soft, flat, nontender.  Vesicular rashes present in the periumbilical area and then wrapping around the left side and is also present in the left flank.. Skin is warm and dry with rash as noted above consistent with herpes zoster. Neurologic: Mental status is normal, cranial nerves are intact, moves all extremities equally.        Procedures   Medications Ordered in the ED  predniSONE  (DELTASONE ) tablet 60 mg (has no administration in time range)  valACYclovir  (VALTREX ) tablet 1,000 mg (has no administration in time range)                                    Medical Decision Making  Herpes zoster in the left T10 dermatome.  I have ordered initial dose of prednisone  and valacyclovir  and I am discharging him with prescriptions for both.     Final diagnoses:  Herpes zoster without complication    ED Discharge Orders          Ordered    predniSONE  (DELTASONE ) 50 MG tablet  Daily  07/29/24 0208    valACYclovir  (VALTREX ) 1000 MG tablet  3 times daily        07/29/24 9791               Raford Lenis, MD 07/29/24 0211  "
